# Patient Record
Sex: Female | Born: 1970
Health system: Southern US, Community
[De-identification: ages and names within clinical notes are randomized; demographics above are authoritative.]

## PROBLEM LIST (undated history)

## (undated) DIAGNOSIS — I10 Essential (primary) hypertension: Secondary | ICD-10-CM

## (undated) DIAGNOSIS — K432 Incisional hernia without obstruction or gangrene: Secondary | ICD-10-CM

## (undated) DIAGNOSIS — O039 Complete or unspecified spontaneous abortion without complication: Secondary | ICD-10-CM

## (undated) DIAGNOSIS — K8020 Calculus of gallbladder without cholecystitis without obstruction: Secondary | ICD-10-CM

## (undated) DIAGNOSIS — Z8632 Personal history of gestational diabetes: Secondary | ICD-10-CM

## (undated) DIAGNOSIS — K219 Gastro-esophageal reflux disease without esophagitis: Secondary | ICD-10-CM

## (undated) HISTORY — DX: Complete or unspecified spontaneous abortion without complication: O03.9

---
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
## 1988-11-10 HISTORY — PX: WISDOM TOOTH EXTRACTION: SHX21

## 1999-12-17 ENCOUNTER — Other Ambulatory Visit: Admission: RE | Admit: 1999-12-17 | Discharge: 1999-12-17 | Payer: Self-pay | Admitting: Obstetrics and Gynecology

## 2000-01-09 DIAGNOSIS — O039 Complete or unspecified spontaneous abortion without complication: Secondary | ICD-10-CM

## 2000-01-09 HISTORY — DX: Complete or unspecified spontaneous abortion without complication: O03.9

## 2000-02-01 ENCOUNTER — Inpatient Hospital Stay (HOSPITAL_COMMUNITY): Admission: AD | Admit: 2000-02-01 | Discharge: 2000-02-01 | Payer: Self-pay | Admitting: Obstetrics & Gynecology

## 2000-02-01 ENCOUNTER — Encounter: Payer: Self-pay | Admitting: Obstetrics & Gynecology

## 2000-02-07 ENCOUNTER — Observation Stay (HOSPITAL_COMMUNITY): Admission: AD | Admit: 2000-02-07 | Discharge: 2000-02-07 | Payer: Self-pay | Admitting: Obstetrics & Gynecology

## 2000-02-07 ENCOUNTER — Encounter: Payer: Self-pay | Admitting: Obstetrics & Gynecology

## 2000-08-13 ENCOUNTER — Encounter: Payer: Self-pay | Admitting: Obstetrics and Gynecology

## 2000-08-13 ENCOUNTER — Ambulatory Visit (HOSPITAL_COMMUNITY): Admission: RE | Admit: 2000-08-13 | Discharge: 2000-08-13 | Payer: Self-pay | Admitting: Obstetrics and Gynecology

## 2000-09-15 ENCOUNTER — Other Ambulatory Visit: Admission: RE | Admit: 2000-09-15 | Discharge: 2000-09-15 | Payer: Self-pay | Admitting: Obstetrics and Gynecology

## 2000-11-10 DIAGNOSIS — Z8632 Personal history of gestational diabetes: Secondary | ICD-10-CM

## 2000-11-10 HISTORY — DX: Personal history of gestational diabetes: Z86.32

## 2001-01-29 ENCOUNTER — Encounter: Admission: RE | Admit: 2001-01-29 | Discharge: 2001-01-29 | Payer: Self-pay | Admitting: Obstetrics and Gynecology

## 2001-03-09 ENCOUNTER — Encounter (HOSPITAL_COMMUNITY): Admission: RE | Admit: 2001-03-09 | Discharge: 2001-03-23 | Payer: Self-pay | Admitting: Obstetrics and Gynecology

## 2001-03-22 ENCOUNTER — Inpatient Hospital Stay (HOSPITAL_COMMUNITY): Admission: AD | Admit: 2001-03-22 | Discharge: 2001-03-25 | Payer: Self-pay | Admitting: Obstetrics and Gynecology

## 2001-04-20 ENCOUNTER — Other Ambulatory Visit: Admission: RE | Admit: 2001-04-20 | Discharge: 2001-04-20 | Payer: Self-pay | Admitting: Obstetrics and Gynecology

## 2002-06-01 ENCOUNTER — Other Ambulatory Visit: Admission: RE | Admit: 2002-06-01 | Discharge: 2002-06-01 | Payer: Self-pay | Admitting: Obstetrics and Gynecology

## 2003-06-06 ENCOUNTER — Other Ambulatory Visit: Admission: RE | Admit: 2003-06-06 | Discharge: 2003-06-06 | Payer: Self-pay | Admitting: Obstetrics and Gynecology

## 2010-12-10 ENCOUNTER — Other Ambulatory Visit: Payer: Self-pay | Admitting: Obstetrics and Gynecology

## 2010-12-10 DIAGNOSIS — Z1231 Encounter for screening mammogram for malignant neoplasm of breast: Secondary | ICD-10-CM

## 2010-12-10 DIAGNOSIS — Z1239 Encounter for other screening for malignant neoplasm of breast: Secondary | ICD-10-CM

## 2010-12-18 ENCOUNTER — Ambulatory Visit
Admission: RE | Admit: 2010-12-18 | Discharge: 2010-12-18 | Disposition: A | Discharge status: Home / Self Care | Payer: BC Managed Care – PPO | Source: Ambulatory Visit | Attending: Obstetrics and Gynecology | Admitting: Obstetrics and Gynecology

## 2010-12-18 DIAGNOSIS — Z1231 Encounter for screening mammogram for malignant neoplasm of breast: Secondary | ICD-10-CM

## 2012-01-06 ENCOUNTER — Other Ambulatory Visit: Payer: Self-pay | Admitting: Obstetrics and Gynecology

## 2012-01-06 DIAGNOSIS — Z1231 Encounter for screening mammogram for malignant neoplasm of breast: Secondary | ICD-10-CM

## 2012-01-16 ENCOUNTER — Ambulatory Visit: Payer: BC Managed Care – PPO

## 2012-01-20 ENCOUNTER — Ambulatory Visit
Admission: RE | Admit: 2012-01-20 | Discharge: 2012-01-20 | Disposition: A | Discharge status: Home / Self Care | Payer: BC Managed Care – PPO | Source: Ambulatory Visit | Attending: Obstetrics and Gynecology | Admitting: Obstetrics and Gynecology

## 2012-01-20 DIAGNOSIS — Z1231 Encounter for screening mammogram for malignant neoplasm of breast: Secondary | ICD-10-CM

## 2012-07-11 HISTORY — PX: TYMPANOSTOMY TUBE PLACEMENT: SHX32

## 2013-02-05 DIAGNOSIS — R1110 Vomiting, unspecified: Secondary | ICD-10-CM | POA: Insufficient documentation

## 2013-02-05 DIAGNOSIS — R1013 Epigastric pain: Secondary | ICD-10-CM | POA: Insufficient documentation

## 2013-02-05 LAB — URINALYSIS, ROUTINE W REFLEX MICROSCOPIC
Bilirubin Urine: NEGATIVE
Glucose, UA: NEGATIVE mg/dL
Ketones, ur: 15 mg/dL — AB
Leukocytes, UA: NEGATIVE
Nitrite: NEGATIVE
Protein, ur: NEGATIVE mg/dL
Specific Gravity, Urine: 1.029 (ref 1.005–1.030)
Urobilinogen, UA: 0.2 mg/dL (ref 0.0–1.0)
pH: 5 (ref 5.0–8.0)

## 2013-02-05 LAB — COMPREHENSIVE METABOLIC PANEL
ALT: 16 U/L (ref 0–35)
AST: 15 U/L (ref 0–37)
Albumin: 3.9 g/dL (ref 3.5–5.2)
Alkaline Phosphatase: 56 U/L (ref 39–117)
BUN: 15 mg/dL (ref 6–23)
CO2: 27 mEq/L (ref 19–32)
Calcium: 9.8 mg/dL (ref 8.4–10.5)
Chloride: 102 mEq/L (ref 96–112)
Creatinine, Ser: 0.76 mg/dL (ref 0.50–1.10)
GFR calc Af Amer: >90 mL/min (ref >90)
GFR calc non Af Amer: >90 mL/min (ref >90)
Glucose, Bld: 120 mg/dL — ABNORMAL HIGH (ref 70–99)
Potassium: 3.8 mEq/L (ref 3.5–5.1)
Sodium: 138 mEq/L (ref 135–145)
Total Bilirubin: 0.3 mg/dL (ref 0.3–1.2)
Total Protein: 7.1 g/dL (ref 6.0–8.3)

## 2013-02-05 LAB — CBC WITH DIFFERENTIAL/PLATELET
Basophils Absolute: 0 10*3/uL (ref 0.0–0.1)
Basophils Relative: 0 % (ref 0–1)
Eosinophils Absolute: 0.1 10*3/uL (ref 0.0–0.7)
Eosinophils Relative: 1 % (ref 0–5)
HCT: 38.2 % (ref 36.0–46.0)
Hemoglobin: 13.4 g/dL (ref 12.0–15.0)
Lymphocytes Relative: 25 % (ref 12–46)
Lymphs Abs: 2 10*3/uL (ref 0.7–4.0)
MCH: 29.8 pg (ref 26.0–34.0)
MCHC: 35.1 g/dL (ref 30.0–36.0)
MCV: 84.9 fL (ref 78.0–100.0)
Monocytes Absolute: 0.4 10*3/uL (ref 0.1–1.0)
Monocytes Relative: 5 % (ref 3–12)
Neutro Abs: 5.5 10*3/uL (ref 1.7–7.7)
Neutrophils Relative %: 68 % (ref 43–77)
Platelets: 231 10*3/uL (ref 150–400)
RBC: 4.5 MIL/uL (ref 3.87–5.11)
RDW: 13.2 % (ref 11.5–15.5)
WBC: 8.1 10*3/uL (ref 4.0–10.5)

## 2013-02-05 LAB — LIPASE, BLOOD: Lipase: 42 U/L (ref 11–59)

## 2013-02-05 LAB — AMYLASE: Amylase: 51 U/L (ref 0–105)

## 2013-02-05 LAB — URINE MICROSCOPIC-ADD ON

## 2013-02-05 LAB — POCT PREGNANCY, URINE: Preg Test, Ur: NEGATIVE

## 2013-02-05 NOTE — ED Notes (Signed)
Pt c/o Abdominal pain, back pain, and HA. Nausea and vomiting. Vomiting x 10. LMP now. Self administered tums x 2, and ibuprofen x 2 with no relief

## 2013-02-06 ENCOUNTER — Encounter (HOSPITAL_COMMUNITY): Payer: Self-pay | Admitting: Emergency Medicine

## 2013-02-06 ENCOUNTER — Emergency Department (HOSPITAL_COMMUNITY)
Admission: EM | Admit: 2013-02-06 | Discharge: 2013-02-06 | Disposition: A | Discharge status: Home / Self Care | Payer: BC Managed Care – PPO | Attending: Emergency Medicine | Admitting: Emergency Medicine

## 2013-02-06 DIAGNOSIS — R109 Unspecified abdominal pain: Secondary | ICD-10-CM

## 2013-02-06 DIAGNOSIS — R1110 Vomiting, unspecified: Secondary | ICD-10-CM

## 2013-02-06 MED ORDER — ONDANSETRON 8 MG PO TBDP: 8.0000 mg | ORAL_TABLET | Freq: Three times a day (TID) | ORAL | Status: DC | PRN

## 2013-02-06 MED ORDER — PANTOPRAZOLE SODIUM 40 MG PO TBEC
40.0000 mg | DELAYED_RELEASE_TABLET | Freq: Every day | ORAL | Status: DC
  Filled 2013-02-06: qty 1

## 2013-02-06 MED ORDER — SUCRALFATE 1 G PO TABS
1.0000 g | ORAL_TABLET | Freq: Once | ORAL | Status: AC
  Administered 2013-02-06: 1 g via ORAL
  Filled 2013-02-06: qty 1

## 2013-02-06 MED ORDER — PANTOPRAZOLE SODIUM 40 MG PO TBEC: 40.0000 mg | DELAYED_RELEASE_TABLET | Freq: Every day | ORAL | Status: AC

## 2013-02-06 MED ORDER — ONDANSETRON 4 MG PO TBDP
8.0000 mg | ORAL_TABLET | Freq: Once | ORAL | Status: AC
  Administered 2013-02-06: 8 mg via ORAL
  Filled 2013-02-06: qty 2

## 2013-02-06 MED ORDER — SUCRALFATE 1 G PO TABS: 1.0000 g | ORAL_TABLET | Freq: Four times a day (QID) | ORAL | Status: DC

## 2013-02-06 NOTE — ED Notes (Signed)
Pt given sprite and graham crackers to do PO challenge. Told pt to notify if she feels sick or gets sick.

## 2013-02-06 NOTE — ED Provider Notes (Signed)
History     CSN: [id number]  Arrival date & time 02/05/13  2252   First MD Initiated Contact with Patient 02/06/13 [phone]      Chief Complaint  Patient presents with  . Abdominal Pain    (Consider location/radiation/quality/duration/timing/severity/associated sxs/prior treatment) HPI 42 year old female presents to emergency department with complaint of nausea and vomiting and upper abdominal pain.  She reports over last 2 weeks she's had some intermittent episodes of what she thought was reflux with discomfort at night, which improved with tums and sitting upright.  Tonight, however, soon after eating she began to have upper abdominal discomfort followed by several episodes of vomiting.  She then had pains radiating across her back.  Patient estimates she vomited about 10 times.  No prior history of similar symptoms.  Patient reports since waiting in the lobby, she is feeling somewhat better.  She has not had any further vomiting.  She describes the pain as a deep boring pain, just above her bellybutton, radiating into her epigastric region.  There is bilateral back pain, slightly worse on the left.  No diarrhea, no fever, no sick contacts, no unusual foods.  No prior abdominal surgeries aside from C-section. No past medical history on file.  No past surgical history on file.  No family history on file.  History  Substance Use Topics  . Smoking status: Not on file  . Smokeless tobacco: Not on file  . Alcohol Use: Not on file    OB History   No data available      Review of Systems  All other systems reviewed and are negative.    Allergies  Cephalosporins  Home Medications   Current Outpatient Rx  Name  Route  Sig  Dispense  Refill  . acetaminophen (TYLENOL) 500 MG tablet   Oral   Take 500 mg by mouth every 6 (six) hours as needed for pain.         . calcium carbonate (TUMS - DOSED IN MG ELEMENTAL CALCIUM) 500 MG chewable tablet   Oral   Chew 2 tablets by mouth  daily as needed for heartburn.         Marland Kitchen ibuprofen (ADVIL,MOTRIN) 200 MG tablet   Oral   Take 400 mg by mouth every 6 (six) hours as needed for pain.           BP 157/98  Pulse 68  Temp(Src) 97.7 F (36.5 C) (Oral)  Resp 20  SpO2 100%  Physical Exam  Nursing note and vitals reviewed. Constitutional: She is oriented to person, place, and time. She appears well-developed and well-nourished.  HENT:  Head: Normocephalic and atraumatic.  Nose: Nose normal.  Mouth/Throat: Oropharynx is clear and moist.  Eyes: Conjunctivae and EOM are normal. Pupils are equal, round, and reactive to light.  Neck: Normal range of motion. Neck supple. No JVD present. No tracheal deviation present. No thyromegaly present.  Cardiovascular: Normal rate, regular rhythm, normal heart sounds and intact distal pulses.  Exam reveals no gallop and no friction rub.   No murmur heard. Pulmonary/Chest: Effort normal and breath sounds normal. No stridor. No respiratory distress. She has no wheezes. She has no rales. She exhibits no tenderness.  Abdominal: Soft. Bowel sounds are normal. She exhibits no distension and no mass. There is tenderness (mild abdominal tenderness in epigastrium, and just above the umbilicus). There is no rebound and no guarding.  No Murphy sign, no rebound, no guarding  Musculoskeletal: Normal range of motion. She exhibits  no edema and no tenderness.  Lymphadenopathy:    She has no cervical adenopathy.  Neurological: She is alert and oriented to person, place, and time. She exhibits normal muscle tone. Coordination normal.  Skin: Skin is warm and dry. No rash noted. No erythema. No pallor.  Psychiatric: She has a normal mood and affect. Her behavior is normal. Judgment and thought content normal.    ED Course  Procedures (including critical care time)  Labs Reviewed  COMPREHENSIVE METABOLIC PANEL - Abnormal; Notable for the following:    Glucose, Bld 120 (*)    All other components  within normal limits  URINALYSIS, ROUTINE W REFLEX MICROSCOPIC - Abnormal; Notable for the following:    Hgb urine dipstick MODERATE (*)    Ketones, ur 15 (*)    All other components within normal limits  URINE MICROSCOPIC-ADD ON - Abnormal; Notable for the following:    Squamous Epithelial / LPF FEW (*)    Bacteria, UA FEW (*)    Casts HYALINE CASTS (*)    All other components within normal limits  CBC WITH DIFFERENTIAL  AMYLASE  LIPASE, BLOOD  POCT PREGNANCY, URINE   No results found.   1. Abdominal pain, acute   2. Vomiting       MDM  42 year old female with acute abdominal pain with vomiting.  Labs are reassuring.  Exam is unremarkable.  Patient may have gastritis or and/or early ulcer.  Will start on Carafate and Protonix.  We'll refer her to gastroenterology.         Olivia Mackie, MD 02/06/13 [phone]

## 2013-02-10 ENCOUNTER — Other Ambulatory Visit: Payer: Self-pay | Admitting: Internal Medicine

## 2013-02-10 DIAGNOSIS — R109 Unspecified abdominal pain: Secondary | ICD-10-CM

## 2013-02-15 ENCOUNTER — Ambulatory Visit
Admission: RE | Admit: 2013-02-15 | Discharge: 2013-02-15 | Disposition: A | Discharge status: Home / Self Care | Payer: BC Managed Care – PPO | Source: Ambulatory Visit | Attending: Internal Medicine | Admitting: Internal Medicine

## 2013-02-15 ENCOUNTER — Other Ambulatory Visit: Payer: Self-pay | Admitting: Internal Medicine

## 2013-02-15 DIAGNOSIS — R1011 Right upper quadrant pain: Secondary | ICD-10-CM

## 2013-02-15 DIAGNOSIS — R110 Nausea: Secondary | ICD-10-CM

## 2013-02-15 DIAGNOSIS — R109 Unspecified abdominal pain: Secondary | ICD-10-CM

## 2013-02-16 ENCOUNTER — Ambulatory Visit
Admission: RE | Admit: 2013-02-16 | Discharge: 2013-02-16 | Disposition: A | Discharge status: Home / Self Care | Payer: BC Managed Care – PPO | Source: Ambulatory Visit | Attending: Internal Medicine | Admitting: Internal Medicine

## 2013-02-16 DIAGNOSIS — R1011 Right upper quadrant pain: Secondary | ICD-10-CM

## 2013-02-16 DIAGNOSIS — R110 Nausea: Secondary | ICD-10-CM

## 2013-03-01 ENCOUNTER — Ambulatory Visit (INDEPENDENT_AMBULATORY_CARE_PROVIDER_SITE_OTHER): Payer: BC Managed Care – PPO | Admitting: General Surgery

## 2013-03-01 ENCOUNTER — Encounter (INDEPENDENT_AMBULATORY_CARE_PROVIDER_SITE_OTHER): Payer: Self-pay | Admitting: General Surgery

## 2013-03-01 VITALS — BP 138/72 | HR 69 | Temp 97.4°F | Resp 18 | Ht 64.0 in | Wt 174.2 lb

## 2013-03-01 DIAGNOSIS — K8020 Calculus of gallbladder without cholecystitis without obstruction: Secondary | ICD-10-CM | POA: Insufficient documentation

## 2013-03-01 NOTE — Progress Notes (Signed)
Patient ID: Julia Miranda, female   DOB: 06-22-1971, 42 y.o.   MRN: [id number]  Chief Complaint  Patient presents with  . New Evaluation    eval GB    HPI Julia Miranda is a 42 y.o. female.  She is referred by Dr. Clinton Sawyer for evaluation and management of symptomatic gallstones. Dr. Theressa Millard is her primary care physician.  The patient has been having some mild episodes of epigastric discomfort for about 2 months. On March 29 she had a severe episode of epigastric pain that radiated to her back associated with nausea and vomiting. She took tums, but that did not help. After about 5 hours she went to the emergency room. Lab work was done which was normal. She was discharged home and told she might have an ulcer. The pain resolved after a total of about 8 hours. She another episode about 10 days later which was also self-limited. These attacks are not necessarily related to meals. An upper GI was performed which shows moderate reflux but no ulcer. An ultrasound shows a 2.6 cm mobile gallstone, no inflammation, no ductal dilatation, small right renal cyst. She has been watching what he she eats. She is on a proton pump inhibitor.Hasn't had any discomfort for about 4 or 5 days.  Comorbidities are minimal, and include the reflux it was in the upper GI, borderline obesity and otherwise she is healthy. She is married with 3 children  HPI  Past Medical History  Diagnosis Date  . Miscarriage     06/1999  . Miscarriage 01/2000    Past Surgical History  Procedure Laterality Date  . Cesarean section  09/18/1995  . Cesarean section  03/22/2001  . Tympanostomy tube placement  07/2012    Left    Family History  Problem Relation Age of Onset  . Hypertension Mother   . Hyperlipidemia Mother   . Diabetes Father   . Cancer Father     Prostate,Melanoma.    Social History History  Substance Use Topics  . Smoking status: Never Smoker   . Smokeless tobacco: Never Used  . Alcohol  Use: No    Allergies  Allergen Reactions  . Cephalosporins Hives    Current Outpatient Prescriptions  Medication Sig Dispense Refill  . acetaminophen (TYLENOL) 500 MG tablet Take 500 mg by mouth every 6 (six) hours as needed for pain.      . chlorpheniramine-HYDROcodone (TUSSIONEX) 10-8 MG/5ML LQCR       . doxycycline (VIBRA-TABS) 100 MG tablet       . ondansetron (ZOFRAN-ODT) 8 MG disintegrating tablet Take 1 tablet (8 mg total) by mouth every 8 (eight) hours as needed for nausea.  20 tablet  0  . pantoprazole (PROTONIX) 40 MG tablet Take 1 tablet (40 mg total) by mouth daily.  30 tablet  0   No current facility-administered medications for this visit.    Review of Systems Review of Systems  Constitutional: Negative for fever, chills and unexpected weight change.  HENT: Negative for hearing loss, congestion, sore throat, trouble swallowing and voice change.   Eyes: Negative for visual disturbance.  Respiratory: Negative for cough and wheezing.   Cardiovascular: Negative for chest pain, palpitations and leg swelling.  Gastrointestinal: Positive for nausea, vomiting and abdominal pain. Negative for diarrhea, constipation, blood in stool, abdominal distention and anal bleeding.  Genitourinary: Negative for hematuria, vaginal bleeding and difficulty urinating.  Musculoskeletal: Negative for arthralgias.  Skin: Negative for rash and wound.  Neurological: Negative  for seizures, syncope and headaches.  Hematological: Negative for adenopathy. Does not bruise/bleed easily.  Psychiatric/Behavioral: Negative for confusion.    Blood pressure 138/72, pulse 69, temperature 97.4 F (36.3 C), temperature source Temporal, resp. rate 18, height 5\' 4"  (1.626 m), weight 174 lb 3.2 oz (79.017 kg), last menstrual period 02/05/2013.  Physical Exam Physical Exam  Constitutional: She is oriented to person, place, and time. She appears well-developed and well-nourished. No distress.  HENT:  Head:  Normocephalic and atraumatic.  Nose: Nose normal.  Mouth/Throat: No oropharyngeal exudate.  Eyes: Conjunctivae and EOM are normal. Pupils are equal, round, and reactive to light. Left eye exhibits no discharge. No scleral icterus.  Neck: Neck supple. No JVD present. No tracheal deviation present. No thyromegaly present.  Cardiovascular: Normal rate, regular rhythm, normal heart sounds and intact distal pulses.   No murmur heard. Pulmonary/Chest: Effort normal and breath sounds normal. No respiratory distress. She has no wheezes. She has no rales. She exhibits no tenderness.  Abdominal: Soft. Bowel sounds are normal. She exhibits no distension and no mass. There is no tenderness. There is no rebound and no guarding.  Well healed Pfannenstiel incision.  Musculoskeletal: She exhibits no edema and no tenderness.  Lymphadenopathy:    She has no cervical adenopathy.  Neurological: She is alert and oriented to person, place, and time. She exhibits normal muscle tone. Coordination normal.  Skin: Skin is warm. No rash noted. She is not diaphoretic. No erythema. No pallor.  Psychiatric: She has a normal mood and affect. Her behavior is normal. Judgment and thought content normal.    Data Reviewed Notes from [facility]. Meds from emergency department. Lab work. Ultrasound. Upper GI.  Assessment    Chronic cholecystitis with cholelithiasis. Recurrent  frequent, recent episodes of biliary colic.  Gastroesophageal reflux demonstrated on upper GI     Plan    The patient will be scheduled for laparoscopic cholecystectomy with cholangiogram in the near future.  I discussed the indications, details, techniques, and numerous risk of the surgery with her. She understands all these issues and all of her questions are answered. She agrees with this plan.        Angelia Mould. Derrell Lolling, M.D., [facility] General and Minimally invasive Surgery Breast and Colorectal  Surgery Office:   [phone] Pager:   [phone]  03/01/2013, 3:30 PM

## 2013-03-01 NOTE — Patient Instructions (Signed)
You have gallstones documented on your ultrasound, and the attacks of pain that you have been having are very typical for gallbladder attacks. This will continue until something is done, and the only good option is a gallbladder operation.  You will be scheduled for a laparoscopic cholecystectomy with cholangiogram, possible open in the near future.        Laparoscopic Cholecystectomy Laparoscopic cholecystectomy is surgery to remove the gallbladder. The gallbladder is located slightly to the right of center in the abdomen, behind the liver. It is a concentrating and storage sac for the bile produced in the liver. Bile aids in the digestion and absorption of fats. Gallbladder disease (cholecystitis) is an inflammation of your gallbladder. This condition is usually caused by a buildup of gallstones (cholelithiasis) in your gallbladder. Gallstones can block the flow of bile, resulting in inflammation and pain. In severe cases, emergency surgery may be required. When emergency surgery is not required, you will have time to prepare for the procedure. Laparoscopic surgery is an alternative to open surgery. Laparoscopic surgery usually has a shorter recovery time. Your common bile duct may also need to be examined and explored. Your caregiver will discuss this with you if he or she feels this should be done. If stones are found in the common bile duct, they may be removed. LET YOUR CAREGIVER KNOW ABOUT:  Allergies to food or medicine.  Medicines taken, including vitamins, herbs, eyedrops, over-the-counter medicines, and creams.  Use of steroids (by mouth or creams).  Previous problems with anesthetics or numbing medicines.  History of bleeding problems or blood clots.  Previous surgery.  Other health problems, including diabetes and kidney problems.  Possibility of pregnancy, if this applies. RISKS AND COMPLICATIONS All surgery is associated with risks. Some problems that may occur following  this procedure include:  Infection.  Damage to the common bile duct, nerves, arteries, veins, or other internal organs such as the stomach or intestines.  Bleeding.  A stone may remain in the common bile duct. BEFORE THE PROCEDURE  Do not take aspirin for 3 days prior to surgery or blood thinners for 1 week prior to surgery.  Do not eat or drink anything after midnight the night before surgery.  Let your caregiver know if you develop a cold or other infectious problem prior to surgery.  You should be present 60 minutes before the procedure or as directed. PROCEDURE  You will be given medicine that makes you sleep (general anesthetic). When you are asleep, your surgeon will make several small cuts (incisions) in your abdomen. One of these incisions is used to insert a small, lighted scope (laparoscope) into the abdomen. The laparoscope helps the surgeon see into your abdomen. Carbon dioxide gas will be pumped into your abdomen. The gas allows more room for the surgeon to perform your surgery. Other operating instruments are inserted through the other incisions. Laparoscopic procedures may not be appropriate when:  There is major scarring from previous surgery.  The gallbladder is extremely inflamed.  There are bleeding disorders or unexpected cirrhosis of the liver.  A pregnancy is near term.  Other conditions make the laparoscopic procedure impossible. If your surgeon feels it is not safe to continue with a laparoscopic procedure, he or she will perform an open abdominal procedure. In this case, the surgeon will make an incision to open the abdomen. This gives the surgeon a larger view and field to work within. This may allow the surgeon to perform procedures that sometimes cannot be  performed with a laparoscope alone. Open surgery has a longer recovery time. AFTER THE PROCEDURE  You will be taken to the recovery area where a nurse will watch and check your progress.  You may be  allowed to go home the same day.  Do not resume physical activities until directed by your caregiver.  You may resume a normal diet and activities as directed. Document Released: 10/27/2005 Document Revised: 01/19/2012 Document Reviewed: 04/11/2011 [facility] Patient Information 2013 [place], [place].

## 2013-03-04 ENCOUNTER — Encounter (HOSPITAL_COMMUNITY): Payer: Self-pay | Admitting: Pharmacy Technician

## 2013-03-08 ENCOUNTER — Other Ambulatory Visit (HOSPITAL_COMMUNITY): Payer: Self-pay | Admitting: *Deleted

## 2013-03-09 ENCOUNTER — Encounter (HOSPITAL_COMMUNITY): Payer: Self-pay

## 2013-03-09 ENCOUNTER — Encounter (HOSPITAL_COMMUNITY)
Admission: RE | Admit: 2013-03-09 | Discharge: 2013-03-09 | Disposition: A | Discharge status: Home / Self Care | Payer: BC Managed Care – PPO | Source: Ambulatory Visit | Attending: General Surgery | Admitting: General Surgery

## 2013-03-09 DIAGNOSIS — K8010 Calculus of gallbladder with chronic cholecystitis without obstruction: Secondary | ICD-10-CM | POA: Insufficient documentation

## 2013-03-09 DIAGNOSIS — Z01812 Encounter for preprocedural laboratory examination: Secondary | ICD-10-CM | POA: Insufficient documentation

## 2013-03-09 HISTORY — DX: Gastro-esophageal reflux disease without esophagitis: K21.9

## 2013-03-09 HISTORY — DX: Personal history of gestational diabetes: Z86.32

## 2013-03-09 HISTORY — DX: Calculus of gallbladder without cholecystitis without obstruction: K80.20

## 2013-03-09 LAB — CBC
HCT: 36.9 % (ref 36.0–46.0)
Hemoglobin: 12.9 g/dL (ref 12.0–15.0)
MCH: 29.3 pg (ref 26.0–34.0)
MCHC: 35 g/dL (ref 30.0–36.0)
MCV: 83.7 fL (ref 78.0–100.0)
Platelets: 255 10*3/uL (ref 150–400)
RBC: 4.41 MIL/uL (ref 3.87–5.11)
RDW: 13 % (ref 11.5–15.5)
WBC: 5.8 10*3/uL (ref 4.0–10.5)

## 2013-03-09 LAB — SURGICAL PCR SCREEN
MRSA, PCR: NEGATIVE
Staphylococcus aureus: NEGATIVE

## 2013-03-09 LAB — HCG, SERUM, QUALITATIVE: Preg, Serum: NEGATIVE

## 2013-03-09 NOTE — Patient Instructions (Signed)
Julia Miranda  03/09/2013                           YOUR PROCEDURE IS SCHEDULED ON: 03/17/13               PLEASE REPORT TO SHORT STAY CENTER AT : 6:30 AM               CALL THIS NUMBER IF ANY PROBLEMS THE DAY OF SURGERY :               [phone]                      REMEMBER:   Do not eat food or drink liquids AFTER MIDNIGHT   Take these medicines the morning of surgery with A SIP OF WATER:  NONE   Do not wear jewelry, make-up   Do not wear lotions, powders, or perfumes.   Do not shave legs or underarms 12 hrs. before surgery (men may shave face)  Do not bring valuables to the hospital.  Contacts, dentures or bridgework may not be worn into surgery.  Leave suitcase in the car. After surgery it may be brought to your room.  For patients admitted to the hospital more than one night, checkout time is 11:00                          The day of discharge.   Patients discharged the day of surgery will not be allowed to drive home                             If going home same day of surgery, must have someone stay with you first                           24 hrs at home and arrange for some one to drive you home from hospital.    Special Instructions:   Please read over the following fact sheets that you were given:               1. MRSA  INFORMATION                      2. [place] PREPARING FOR SURGERY SHEET                                                X_____________________________________________________________________        Failure to follow these instructions may result in cancellation of your surgery

## 2013-03-16 NOTE — H&P (Signed)
Julia Miranda   MRN:  [id number]   Description: 42 year old female  Provider: [name], MD  Department: [facility]       Diagnoses    Gallstones    -  Primary    574.20       Current Vitals -    BP Pulse Temp(Src) Resp Ht Wt    138/72 69 97.4 F (36.3 C) (Temporal) 18 5\' 4"  (1.626 m) 174 lb 3.2 oz (79.017 kg)     BMI - 29.89 kg/m2 02/05/2013              History and Physical    [name], MD   Status: Signed                          HPI Julia Miranda is a 42 y.o. female.  She is referred by Dr. Clinton Sawyer for evaluation and management of symptomatic gallstones. Dr. Theressa Millard is her primary care physician.   The patient has been having some mild episodes of epigastric discomfort for about 2 months. On March 29 she had a severe episode of epigastric pain that radiated to her back associated with nausea and vomiting. She took tums, but that did not help. After about 5 hours she went to the emergency room. Lab work was done which was normal. She was discharged home and told she might have an ulcer. The pain resolved after a total of about 8 hours. She another episode about 10 days later which was also self-limited. These attacks are not necessarily related to meals. An upper GI was performed which shows moderate reflux but no ulcer. An ultrasound shows a 2.6 cm mobile gallstone, no inflammation, no ductal dilatation, small right renal cyst. She has been watching what he she eats. She is on a proton pump inhibitor.Hasn't had any discomfort for about 4 or 5 days.   Comorbidities are minimal, and include the reflux it was in the upper GI, borderline obesity and otherwise she is healthy. She is married with 3 children        Past Medical History   Diagnosis  Date   .  Miscarriage         06/1999   .  Miscarriage  01/2000         Past Surgical History   Procedure  Laterality  Date   .  Cesarean section    09/18/1995   .  Cesarean  section    03/22/2001   .  Tympanostomy tube placement    07/2012       Left         Family History   Problem  Relation  Age of Onset   .  Hypertension  Mother     .  Hyperlipidemia  Mother     .  Diabetes  Father     .  Cancer  Father         Prostate,Melanoma.        Social History History   Substance Use Topics   .  Smoking status:  Never Smoker    .  Smokeless tobacco:  Never Used   .  Alcohol Use:  No         Allergies   Allergen  Reactions   .  Cephalosporins  Hives         Current Outpatient Prescriptions   Medication  Sig  Dispense  Refill   .  acetaminophen (TYLENOL) 500 MG tablet  Take 500 mg by mouth every 6 (six) hours as needed for pain.         .  chlorpheniramine-HYDROcodone (TUSSIONEX) 10-8 MG/5ML LQCR           .  doxycycline (VIBRA-TABS) 100 MG tablet           .  ondansetron (ZOFRAN-ODT) 8 MG disintegrating tablet  Take 1 tablet (8 mg total) by mouth every 8 (eight) hours as needed for nausea.   20 tablet   0   .  pantoprazole (PROTONIX) 40 MG tablet  Take 1 tablet (40 mg total) by mouth daily.   30 tablet   0       No current facility-administered medications for this visit.        Review of Systems   Constitutional: Negative for fever, chills and unexpected weight change.  HENT: Negative for hearing loss, congestion, sore throat, trouble swallowing and voice change.   Eyes: Negative for visual disturbance.  Respiratory: Negative for cough and wheezing.   Cardiovascular: Negative for chest pain, palpitations and leg swelling.  Gastrointestinal: Positive for nausea, vomiting and abdominal pain. Negative for diarrhea, constipation, blood in stool, abdominal distention and anal bleeding.  Genitourinary: Negative for hematuria, vaginal bleeding and difficulty urinating.  Musculoskeletal: Negative for arthralgias.  Skin: Negative for rash and wound.  Neurological: Negative for seizures, syncope and headaches.  Hematological: Negative for  adenopathy. Does not bruise/bleed easily.  Psychiatric/Behavioral: Negative for confusion.      Blood pressure 138/72, pulse 69, temperature 97.4 F (36.3 C), temperature source Temporal, resp. rate 18, height 5\' 4"  (1.626 m), weight 174 lb 3.2 oz (79.017 kg), last menstrual period 02/05/2013.   Physical Exam  Constitutional: She is oriented to person, place, and time. She appears well-developed and well-nourished. No distress.  HENT:   Head: Normocephalic and atraumatic.   Nose: Nose normal.   Mouth/Throat: No oropharyngeal exudate.  Eyes: Conjunctivae and EOM are normal. Pupils are equal, round, and reactive to light. Left eye exhibits no discharge. No scleral icterus.  Neck: Neck supple. No JVD present. No tracheal deviation present. No thyromegaly present.  Cardiovascular: Normal rate, regular rhythm, normal heart sounds and intact distal pulses.    No murmur heard. Pulmonary/Chest: Effort normal and breath sounds normal. No respiratory distress. She has no wheezes. She has no rales. She exhibits no tenderness.  Abdominal: Soft. Bowel sounds are normal. She exhibits no distension and no mass. There is no tenderness. There is no rebound and no guarding.  Well healed Pfannenstiel incision.  Musculoskeletal: She exhibits no edema and no tenderness.  Lymphadenopathy:    She has no cervical adenopathy.  Neurological: She is alert and oriented to person, place, and time. She exhibits normal muscle tone. Coordination normal.  Skin: Skin is warm. No rash noted. She is not diaphoretic. No erythema. No pallor.  Psychiatric: She has a normal mood and affect. Her behavior is normal. Judgment and thought content normal.      Data Reviewed Notes from [facility] [place]. Meds from emergency department. Lab work. Ultrasound. Upper GI.   Assessment    Chronic cholecystitis with cholelithiasis. Recurrent , frequent, recent episodes of biliary colic.   Gastroesophageal reflux  demonstrated on upper GI      Plan    The patient will be scheduled for laparoscopic cholecystectomy with cholangiogram in the near future.   I discussed the indications, details, techniques, and  numerous risk of the surgery with her. She understands all these issues and all of her questions are answered. She agrees with this plan.           Angelia Mould. Derrell Lolling, M.D., [facility] General and Minimally invasive Surgery Breast and Colorectal Surgery Office:   [phone] Pager:   [phone]

## 2013-03-17 ENCOUNTER — Encounter (HOSPITAL_COMMUNITY): Payer: Self-pay | Admitting: Anesthesiology

## 2013-03-17 ENCOUNTER — Ambulatory Visit (HOSPITAL_COMMUNITY)
Admission: RE | Admit: 2013-03-17 | Discharge: 2013-03-17 | Disposition: A | Discharge status: Home / Self Care | Payer: BC Managed Care – PPO | Source: Ambulatory Visit | Attending: General Surgery | Admitting: General Surgery

## 2013-03-17 ENCOUNTER — Encounter (HOSPITAL_COMMUNITY): Payer: Self-pay | Admitting: *Deleted

## 2013-03-17 ENCOUNTER — Ambulatory Visit (HOSPITAL_COMMUNITY): Payer: BC Managed Care – PPO

## 2013-03-17 ENCOUNTER — Encounter (HOSPITAL_COMMUNITY)
Admission: RE | Disposition: A | Discharge status: Home / Self Care | Payer: Self-pay | Source: Ambulatory Visit | Attending: General Surgery

## 2013-03-17 ENCOUNTER — Ambulatory Visit (HOSPITAL_COMMUNITY): Payer: BC Managed Care – PPO | Admitting: Anesthesiology

## 2013-03-17 DIAGNOSIS — K219 Gastro-esophageal reflux disease without esophagitis: Secondary | ICD-10-CM | POA: Insufficient documentation

## 2013-03-17 DIAGNOSIS — K8020 Calculus of gallbladder without cholecystitis without obstruction: Secondary | ICD-10-CM | POA: Diagnosis present

## 2013-03-17 DIAGNOSIS — K8010 Calculus of gallbladder with chronic cholecystitis without obstruction: Secondary | ICD-10-CM

## 2013-03-17 DIAGNOSIS — K824 Cholesterolosis of gallbladder: Secondary | ICD-10-CM

## 2013-03-17 HISTORY — PX: CHOLECYSTECTOMY: SHX55

## 2013-03-17 LAB — COMPREHENSIVE METABOLIC PANEL
ALT: 13 U/L (ref 0–35)
AST: 16 U/L (ref 0–37)
Albumin: 3.2 g/dL — ABNORMAL LOW (ref 3.5–5.2)
Alkaline Phosphatase: 53 U/L (ref 39–117)
BUN: 9 mg/dL (ref 6–23)
CO2: 27 mEq/L (ref 19–32)
Calcium: 8.8 mg/dL (ref 8.4–10.5)
Chloride: 101 mEq/L (ref 96–112)
Creatinine, Ser: 0.65 mg/dL (ref 0.50–1.10)
GFR calc Af Amer: >90 mL/min (ref >90)
GFR calc non Af Amer: >90 mL/min (ref >90)
Glucose, Bld: 138 mg/dL — ABNORMAL HIGH (ref 70–99)
Potassium: 4.1 mEq/L (ref 3.5–5.1)
Sodium: 136 mEq/L (ref 135–145)
Total Bilirubin: 0.3 mg/dL (ref 0.3–1.2)
Total Protein: 6 g/dL (ref 6.0–8.3)

## 2013-03-17 SURGERY — LAPAROSCOPIC CHOLECYSTECTOMY WITH INTRAOPERATIVE CHOLANGIOGRAM
Anesthesia: General | Site: Abdomen

## 2013-03-17 MED ORDER — SODIUM CHLORIDE 0.9 % IR SOLN
Status: DC | PRN
  Administered 2013-03-17: 6 mL

## 2013-03-17 MED ORDER — SUCCINYLCHOLINE CHLORIDE 20 MG/ML IJ SOLN
INTRAMUSCULAR | Status: DC | PRN
  Administered 2013-03-17: 100 mg via INTRAVENOUS

## 2013-03-17 MED ORDER — LIDOCAINE HCL (CARDIAC) 20 MG/ML IV SOLN
INTRAVENOUS | Status: DC | PRN
  Administered 2013-03-17: 100 mg via INTRAVENOUS

## 2013-03-17 MED ORDER — CIPROFLOXACIN IN D5W 400 MG/200ML IV SOLN
INTRAVENOUS | Status: AC
  Filled 2013-03-17: qty 200

## 2013-03-17 MED ORDER — MIDAZOLAM HCL 5 MG/5ML IJ SOLN
INTRAMUSCULAR | Status: DC | PRN
  Administered 2013-03-17: 2 mg via INTRAVENOUS

## 2013-03-17 MED ORDER — IOHEXOL 300 MG/ML  SOLN
INTRAMUSCULAR | Status: DC | PRN
  Administered 2013-03-17: 6 mL via INTRAVENOUS

## 2013-03-17 MED ORDER — IOHEXOL 300 MG/ML  SOLN
INTRAMUSCULAR | Status: AC
  Filled 2013-03-17: qty 1

## 2013-03-17 MED ORDER — FENTANYL CITRATE 0.05 MG/ML IJ SOLN
INTRAMUSCULAR | Status: AC
  Filled 2013-03-17: qty 2

## 2013-03-17 MED ORDER — CIPROFLOXACIN IN D5W 400 MG/200ML IV SOLN
400.0000 mg | INTRAVENOUS | Status: AC
  Administered 2013-03-17: 400 mg via INTRAVENOUS

## 2013-03-17 MED ORDER — PROPOFOL 10 MG/ML IV BOLUS
INTRAVENOUS | Status: DC | PRN
  Administered 2013-03-17: 100 mg via INTRAVENOUS

## 2013-03-17 MED ORDER — LACTATED RINGERS IV SOLN: INTRAVENOUS | Status: DC

## 2013-03-17 MED ORDER — PROMETHAZINE HCL 25 MG/ML IJ SOLN
INTRAMUSCULAR | Status: AC
  Filled 2013-03-17: qty 1

## 2013-03-17 MED ORDER — ACETAMINOPHEN 10 MG/ML IV SOLN
INTRAVENOUS | Status: DC | PRN
  Administered 2013-03-17: 1000 mg via INTRAVENOUS

## 2013-03-17 MED ORDER — ONDANSETRON HCL 4 MG/2ML IJ SOLN
INTRAMUSCULAR | Status: DC | PRN
  Administered 2013-03-17: 4 mg via INTRAVENOUS

## 2013-03-17 MED ORDER — KETOROLAC TROMETHAMINE 30 MG/ML IJ SOLN
INTRAMUSCULAR | Status: AC
  Filled 2013-03-17: qty 1

## 2013-03-17 MED ORDER — KETOROLAC TROMETHAMINE 30 MG/ML IJ SOLN
30.0000 mg | Freq: Once | INTRAMUSCULAR | Status: AC
  Administered 2013-03-17: 30 mg via INTRAVENOUS

## 2013-03-17 MED ORDER — CHLORHEXIDINE GLUCONATE 4 % EX LIQD
1.0000 "application " | Freq: Once | CUTANEOUS | Status: DC
  Filled 2013-03-17: qty 15

## 2013-03-17 MED ORDER — FENTANYL CITRATE 0.05 MG/ML IJ SOLN
25.0000 ug | INTRAMUSCULAR | Status: DC | PRN
  Administered 2013-03-17: 50 ug via INTRAVENOUS

## 2013-03-17 MED ORDER — LACTATED RINGERS IV SOLN
INTRAVENOUS | Status: DC
  Administered 2013-03-17 (×2): via INTRAVENOUS

## 2013-03-17 MED ORDER — MEPERIDINE HCL 50 MG/ML IJ SOLN: 6.2500 mg | INTRAMUSCULAR | Status: DC | PRN

## 2013-03-17 MED ORDER — ACETAMINOPHEN 10 MG/ML IV SOLN
INTRAVENOUS | Status: AC
  Filled 2013-03-17: qty 100

## 2013-03-17 MED ORDER — BUPIVACAINE-EPINEPHRINE 0.5% -1:200000 IJ SOLN
INTRAMUSCULAR | Status: DC | PRN
  Administered 2013-03-17: 22 mL

## 2013-03-17 MED ORDER — FENTANYL CITRATE 0.05 MG/ML IJ SOLN
INTRAMUSCULAR | Status: DC | PRN
  Administered 2013-03-17 (×3): 50 ug via INTRAVENOUS

## 2013-03-17 MED ORDER — ROCURONIUM BROMIDE 100 MG/10ML IV SOLN
INTRAVENOUS | Status: DC | PRN
  Administered 2013-03-17: 30 mg via INTRAVENOUS

## 2013-03-17 MED ORDER — BUPIVACAINE-EPINEPHRINE 0.5% -1:200000 IJ SOLN
INTRAMUSCULAR | Status: AC
  Filled 2013-03-17: qty 1

## 2013-03-17 MED ORDER — HYDROCODONE-ACETAMINOPHEN 5-325 MG PO TABS: 1.0000 | ORAL_TABLET | ORAL | Status: DC | PRN

## 2013-03-17 MED ORDER — DEXAMETHASONE SODIUM PHOSPHATE 10 MG/ML IJ SOLN
INTRAMUSCULAR | Status: DC | PRN
  Administered 2013-03-17: 10 mg via INTRAVENOUS

## 2013-03-17 MED ORDER — NEOSTIGMINE METHYLSULFATE 1 MG/ML IJ SOLN
INTRAMUSCULAR | Status: DC | PRN
  Administered 2013-03-17: 4 mg via INTRAVENOUS

## 2013-03-17 MED ORDER — PROMETHAZINE HCL 25 MG/ML IJ SOLN
6.2500 mg | INTRAMUSCULAR | Status: DC | PRN
  Administered 2013-03-17: 6.25 mg via INTRAVENOUS

## 2013-03-17 MED ORDER — GLYCOPYRROLATE 0.2 MG/ML IJ SOLN
INTRAMUSCULAR | Status: DC | PRN
  Administered 2013-03-17: 0.6 mg via INTRAVENOUS

## 2013-03-17 MED ORDER — OXYCODONE HCL 5 MG PO TABS
5.0000 mg | ORAL_TABLET | ORAL | Status: DC | PRN
  Administered 2013-03-17: 5 mg via ORAL
  Filled 2013-03-17: qty 1
  Filled 2013-03-17: qty 2

## 2013-03-17 SURGICAL SUPPLY — 40 items
ADH SKN CLS APL DERMABOND .7 (GAUZE/BANDAGES/DRESSINGS) ×1
APL SKNCLS STERI-STRIP NONHPOA (GAUZE/BANDAGES/DRESSINGS) ×1
APPLIER CLIP ROT 10 11.4 M/L (STAPLE) ×2
APR CLP MED LRG 11.4X10 (STAPLE) ×1
BAG SPEC RTRVL LRG 6X4 10 (ENDOMECHANICALS) ×1
BENZOIN TINCTURE PRP APPL 2/3 (GAUZE/BANDAGES/DRESSINGS) ×2 IMPLANT
CANISTER SUCTION 2500CC (MISCELLANEOUS) ×2 IMPLANT
CLIP APPLIE ROT 10 11.4 M/L (STAPLE) ×1 IMPLANT
CLOTH BEACON ORANGE TIMEOUT ST (SAFETY) ×2 IMPLANT
COVER MAYO STAND STRL (DRAPES) ×2 IMPLANT
DECANTER SPIKE VIAL GLASS SM (MISCELLANEOUS) ×2 IMPLANT
DERMABOND ADVANCED (GAUZE/BANDAGES/DRESSINGS) ×1
DERMABOND ADVANCED .7 DNX12 (GAUZE/BANDAGES/DRESSINGS) ×1 IMPLANT
DRAPE C-ARM 42X72 X-RAY (DRAPES) ×2 IMPLANT
DRAPE LAPAROSCOPIC ABDOMINAL (DRAPES) ×2 IMPLANT
ELECT REM PT RETURN 9FT ADLT (ELECTROSURGICAL) ×2
ELECTRODE REM PT RTRN 9FT ADLT (ELECTROSURGICAL) ×1 IMPLANT
GLOVE BIOGEL PI IND STRL 7.0 (GLOVE) ×1 IMPLANT
GLOVE BIOGEL PI INDICATOR 7.0 (GLOVE) ×2
GLOVE EUDERMIC 7 POWDERFREE (GLOVE) ×2 IMPLANT
GOWN STRL NON-REIN LRG LVL3 (GOWN DISPOSABLE) ×2 IMPLANT
GOWN STRL REIN XL XLG (GOWN DISPOSABLE) ×5 IMPLANT
HEMOSTAT SNOW SURGICEL 2X4 (HEMOSTASIS) ×1 IMPLANT
IV LACTATED RINGER IRRG 3000ML (IV SOLUTION) ×2
IV LR IRRIG 3000ML ARTHROMATIC (IV SOLUTION) ×1 IMPLANT
KIT BASIN OR (CUSTOM PROCEDURE TRAY) ×2 IMPLANT
NS IRRIG 1000ML POUR BTL (IV SOLUTION) ×2 IMPLANT
POUCH SPECIMEN RETRIEVAL 10MM (ENDOMECHANICALS) ×1 IMPLANT
SET CHOLANGIOGRAPH MIX (MISCELLANEOUS) ×2 IMPLANT
SET IRRIG TUBING LAPAROSCOPIC (IRRIGATION / IRRIGATOR) ×2 IMPLANT
SOLUTION ANTI FOG 6CC (MISCELLANEOUS) ×2 IMPLANT
STRIP CLOSURE SKIN 1/2X4 (GAUZE/BANDAGES/DRESSINGS) ×2 IMPLANT
SUT MNCRL AB 4-0 PS2 18 (SUTURE) ×2 IMPLANT
SYRINGE IRR TOOMEY STRL 70CC (SYRINGE) ×1 IMPLANT
TOWEL OR 17X26 10 PK STRL BLUE (TOWEL DISPOSABLE) ×4 IMPLANT
TRAY LAP CHOLE (CUSTOM PROCEDURE TRAY) ×2 IMPLANT
TROCAR BLADELESS OPT 5 75 (ENDOMECHANICALS) ×2 IMPLANT
TROCAR XCEL BLUNT TIP 100MML (ENDOMECHANICALS) ×2 IMPLANT
TROCAR XCEL NON-BLD 11X100MML (ENDOMECHANICALS) IMPLANT
TUBING INSUFFLATION 10FT LAP (TUBING) ×2 IMPLANT

## 2013-03-17 NOTE — Progress Notes (Signed)
[facility] results called to Dr. Doreen Salvage results)

## 2013-03-17 NOTE — Op Note (Signed)
Patient Name:           [name]   Date of Surgery:        03/17/2013  Pre op Diagnosis:      Chronic cholecystitis with cholelithiasis  Post op Diagnosis:  1)  Chronic cholecystitis with cholelithiasis                                     2)  Question of tiny,  solitary, distal common bile duct stone without obstruction  Procedure:                 Laparoscopic cholecystectomy with intraoperative cholangiogram  Surgeon:                     Angelia Mould. Derrell Lolling, M.D., FACS  Assistant:                      Zola Button, [place]  Operative Indications:   Julia Miranda is a 42 y.o. female. She is referred by Dr. Clinton Sawyer for evaluation and management of symptomatic gallstones. Dr. Theressa Millard is her primary care physician.  The patient has been having some mild episodes of epigastric discomfort for about 2 months. On March 29 she had a severe episode of epigastric pain that radiated to her back associated with nausea and vomiting. She took tums, but that did not help. After about 5 hours she went to the emergency room. Lab work, Including liver function tests were normal. She was discharged home and told she might have an ulcer. The pain resolved after a total of about 8 hours. She another episode about 10 days later which was also self-limited. These attacks are not necessarily related to meals. An upper GI was performed which shows moderate reflux but no ulcer. An ultrasound shows a 2.6 cm mobile gallstone, no inflammation, no ductal dilatation, small right renal cyst. She has been watching what he she eats. She is on a proton pump inhibitor.  Symptoms have subsided.  Comorbidities are minimal, and include the reflux it was in the upper GI, borderline obesity and otherwise she is healthy. She is married with 3 children    Operative Findings:      The gallbladder was thin-walled, chronically inflamed. The anatomy of the cystic duct, cystic artery and common bile duct was conventional.  Intraoperative cholangiogram showed normal intrahepatic and extrahepatic biliary anatomy, no obstruction with good flow of contrast into the duodenum, no dilatation. In the very distal common bile duct there was about a 2 mm at most filling defect that might be an air bubble or a stone. It was not causing any obstruction. This was discussed with radiology. The stomach, duodenum, small intestine, large intestine, were grossly normal to inspection.  Procedure in Detail:          Following the induction of general endotracheal anesthesia the patient's abdomen was prepped and draped in a sterile fashion, intravenous antibiotics were given, and a surgical time out was performed. 0.5% Marcaine with epinephrine was used as a local infiltration anesthetic. A vertical incision was made in the lower rim of the umbilicus. The fascia was incised in the midline and the abdominal cavity entered under direct vision. A 11 mm Hassan trocar was inserted and secured with a pursestring  suture of 0 Vicryl. Pneumoperitoneum was created, 11 mm trocar placed in the subxiphoid region  Two 5 mm trocars placed in the right upper quadrant. The gallbladder fundus was identified and elevated. I identified the infundibulum and dissected some adhesions off of that. I then incised the peritoneum medial and lateral and dissected out the cystic duct and cystic artery. I created a critical view of safety with a nice window behind the cystic duct and artery. and only 2 structures going to the gallbladder and a window behind seeing the base of the liver. The cystic artery was secured with metal clips and divided. The cholangiogram catheter was inserted into the cystic duct. A cholangiogram was obtained using the C-arm.  The cholangiogram was normal as described above with the exception of a possible, 2 mm filling defect in the distal common bile duct. This was causing no dilatation or obstruction. Considering her normal liver function test I did not  feel that we should explore the common duct at this point in time,  thinking that this may pass without symptoms. The cholangiogram catheter was removed. The cystic duct was secured with 3 hemoclips and divided. The gallbladder was dissected from its bed with  Electrocautery,placed in a specimen bag and removed. The operative field was copiously irrigated with saline. At the completion of the case there was no bleeding or bile leak anywhere in the gallbladder bed or around the area of the clips. The trocars were removed and there was no bleeding from the trocar sites. The pneumoperitoneum was released. The fascia at the umbilicus was closed with 0 Vicryl sutures and the skin closed with subcuticular sutures of 4-0 Monocryl and Dermabond. Patient tolerated she will take the recovery room in stable condition. EBL 10 cc. Counts correct. Complications none.     Angelia Mould. Derrell Lolling, M.D., FACS General and Minimally Invasive Surgery Breast and Colorectal Surgery  03/17/2013 10:06 AM

## 2013-03-17 NOTE — Anesthesia Preprocedure Evaluation (Addendum)
Anesthesia Evaluation  Patient identified by MRN, date of birth, ID band Patient awake    Reviewed: Allergy & Precautions, H&P , NPO status , Patient's Chart, lab work & pertinent test results  Airway Mallampati: II TM Distance: >3 FB Neck ROM: full    Dental no notable dental hx.    Pulmonary neg pulmonary ROS,  breath sounds clear to auscultation  Pulmonary exam normal       Cardiovascular Exercise Tolerance: Good negative cardio ROS  Rhythm:regular Rate:Normal     Neuro/Psych negative neurological ROS  negative psych ROS   GI/Hepatic negative GI ROS, Neg liver ROS,   Endo/Other  negative endocrine ROS  Renal/GU negative Renal ROS  negative genitourinary   Musculoskeletal   Abdominal   Peds  Hematology negative hematology ROS (+)   Anesthesia Other Findings   Reproductive/Obstetrics negative OB ROS                           Anesthesia Physical Anesthesia Plan  ASA: II  Anesthesia Plan: General   Post-op Pain Management:    Induction:   Airway Management Planned:   Additional Equipment:   Intra-op Plan:   Post-operative Plan:   Informed Consent: I have reviewed the patients History and Physical, chart, labs and discussed the procedure including the risks, benefits and alternatives for the proposed anesthesia with the patient or authorized representative who has indicated his/her understanding and acceptance.   Dental Advisory Given  Plan Discussed with: CRNA  Anesthesia Plan Comments:         Anesthesia Quick Evaluation

## 2013-03-17 NOTE — Interval H&P Note (Signed)
History and Physical Interval Note:  03/17/2013 8:46 AM  Julia Miranda  has presented today for surgery, with the diagnosis of gallblader  The goals and the various methods of treatment have been discussed with the patient and family. After consideration of risks, benefits and other options for treatment, the patient has consented to  Procedure(s): LAPAROSCOPIC CHOLECYSTECTOMY WITH INTRAOPERATIVE CHOLANGIOGRAM (N/A) as a surgical intervention .  The patient's history has been reviewed, patient examined, no change in status, stable for surgery.  I have reviewed the patient's chart and labs.  Questions were answered to the patient's satisfaction.     [name]

## 2013-03-17 NOTE — Anesthesia Postprocedure Evaluation (Signed)
  Anesthesia Post-op Note  Patient: Julia Miranda  Procedure(s) Performed: Procedure(s) (LRB): LAPAROSCOPIC CHOLECYSTECTOMY WITH INTRAOPERATIVE CHOLANGIOGRAM (N/A)  Patient Location: PACU  Anesthesia Type: General  Level of Consciousness: awake and alert   Airway and Oxygen Therapy: Patient Spontanous Breathing  Post-op Pain: mild  Post-op Assessment: Post-op Vital signs reviewed, Patient's Cardiovascular Status Stable, Respiratory Function Stable, Patent Airway and No signs of Nausea or vomiting  Last Vitals:  Filed Vitals:   03/17/13 1300  BP: 132/82  Pulse:   Temp:   Resp: 16    Post-op Vital Signs: stable   Complications: No apparent anesthesia complications

## 2013-03-17 NOTE — Transfer of Care (Signed)
Immediate Anesthesia Transfer of Care Note  Patient: Julia Miranda  Procedure(s) Performed: Procedure(s): LAPAROSCOPIC CHOLECYSTECTOMY WITH INTRAOPERATIVE CHOLANGIOGRAM (N/A)  Patient Location: PACU  Anesthesia Type:General  Level of Consciousness: sedated  Airway & Oxygen Therapy: Patient Spontanous Breathing and Patient connected to face mask oxygen  Post-op Assessment: Report given to PACU RN and Post -op Vital signs reviewed and stable  Post vital signs: Reviewed and stable  Complications: No apparent anesthesia complications

## 2013-03-17 NOTE — Progress Notes (Signed)
C-MET drawn by lab.

## 2013-03-18 ENCOUNTER — Encounter (HOSPITAL_COMMUNITY): Payer: Self-pay | Admitting: General Surgery

## 2013-03-31 ENCOUNTER — Encounter (INDEPENDENT_AMBULATORY_CARE_PROVIDER_SITE_OTHER): Payer: Self-pay | Admitting: General Surgery

## 2013-03-31 ENCOUNTER — Ambulatory Visit (INDEPENDENT_AMBULATORY_CARE_PROVIDER_SITE_OTHER): Payer: BC Managed Care – PPO | Admitting: General Surgery

## 2013-03-31 VITALS — BP 120/82 | HR 60 | Temp 97.6°F | Resp 18 | Ht 64.0 in | Wt 172.0 lb

## 2013-03-31 DIAGNOSIS — K8020 Calculus of gallbladder without cholecystitis without obstruction: Secondary | ICD-10-CM

## 2013-03-31 NOTE — Progress Notes (Signed)
Patient ID: Julia Miranda, female   DOB: 06-Aug-1971, 42 y.o.   MRN: [id number] History: This patient underwent laparoscopic cholecystectomy with cholangiogram on 03/17/2013. She has recovered uneventfully. She has no pain. No nausea. Normal bowel function. All of her symptoms have completely resolved and she is pleased with her progress. I discussed with her the fact that her cholangiogram showed a tiny, nonobstructing filling defect in her distal CBD. Liver function tests preop and postop were normal. I advised her that this may or may not have been a common duct stone but that this has probably passed into her intestine. She was advised to call us if she develops abdominal pain and blood work should be drawn for liver function test. I drew pictures of this and gave that to her. Her pathology report shows a single gallstone and chronic cholecystitis. I gave her a copy of the pathology report  Exam the patient looks well. Good spirits.  No distress. Abdomen soft. Nontender. All trocar sites are healing well. I removed a little bit of Dermabond from the umbilicus. No hernias.  Assessment: Chronic cholecystitis with cholelithiasis, uneventful recovery following laparoscopic cholecystectomy and cholangiogram. Question tiny non-obstructing filling defect, distal CBD on Cholangiogram. Asymptomatic. Suspect this will be a self-limited process. No intervention advised  Plan: Call if develops  abdominal pain and we will check her liver function tests Resume normal activities Low fat dietr Return to see me PRN   Angelia Mould. Derrell Lolling, M.D., [facility] General and Minimally invasive Surgery Breast and Colorectal Surgery Office:   [phone] Pager:   [phone]

## 2013-03-31 NOTE — Patient Instructions (Signed)
You have recovered from the gallbladder surgery without any apparent complications.  You may resume normal activities without restriction  Follow a low-fat diet  We talked about the slight abnormality seen on your cholangiogram, and whether this might be an air bubble or a tiny common duct stone. Since you are asymptomatic and your liver function tests were normal, I do not think anything further needs to be done. Chances are this has passed into your intestine and it will not recur. If you develop upper abdominal pain and nausea, you should have evaluation by physician and blood work for liver function tests.  Return to see Dr. Derrell Lolling if further problems arise.

## 2014-03-01 ENCOUNTER — Other Ambulatory Visit: Payer: Self-pay

## 2014-03-01 DIAGNOSIS — Z1231 Encounter for screening mammogram for malignant neoplasm of breast: Secondary | ICD-10-CM

## 2014-03-16 ENCOUNTER — Encounter (INDEPENDENT_AMBULATORY_CARE_PROVIDER_SITE_OTHER): Payer: Self-pay

## 2014-03-16 ENCOUNTER — Ambulatory Visit
Admission: RE | Admit: 2014-03-16 | Discharge: 2014-03-16 | Disposition: A | Discharge status: Home / Self Care | Payer: BC Managed Care – PPO | Source: Ambulatory Visit

## 2014-03-16 DIAGNOSIS — Z1231 Encounter for screening mammogram for malignant neoplasm of breast: Secondary | ICD-10-CM

## 2014-03-23 ENCOUNTER — Other Ambulatory Visit: Payer: Self-pay | Admitting: Dermatology

## 2014-11-28 ENCOUNTER — Ambulatory Visit
Admission: RE | Admit: 2014-11-28 | Discharge: 2014-11-28 | Disposition: A | Discharge status: Home / Self Care | Payer: BLUE CROSS/BLUE SHIELD | Source: Ambulatory Visit | Attending: Nurse Practitioner | Admitting: Nurse Practitioner

## 2014-11-28 ENCOUNTER — Other Ambulatory Visit: Payer: Self-pay | Admitting: Nurse Practitioner

## 2014-11-28 DIAGNOSIS — R591 Generalized enlarged lymph nodes: Secondary | ICD-10-CM

## 2015-05-10 ENCOUNTER — Ambulatory Visit: Payer: Self-pay | Admitting: Podiatry

## 2015-06-21 ENCOUNTER — Other Ambulatory Visit: Payer: Self-pay

## 2015-06-21 DIAGNOSIS — Z1231 Encounter for screening mammogram for malignant neoplasm of breast: Secondary | ICD-10-CM

## 2015-08-02 ENCOUNTER — Ambulatory Visit: Payer: BLUE CROSS/BLUE SHIELD

## 2015-09-04 ENCOUNTER — Other Ambulatory Visit: Payer: Self-pay | Admitting: Obstetrics and Gynecology

## 2015-09-05 ENCOUNTER — Encounter (HOSPITAL_COMMUNITY): Payer: Self-pay | Admitting: *Deleted

## 2015-09-11 ENCOUNTER — Ambulatory Visit (HOSPITAL_COMMUNITY): Payer: BLUE CROSS/BLUE SHIELD | Admitting: Anesthesiology

## 2015-09-11 ENCOUNTER — Ambulatory Visit (HOSPITAL_COMMUNITY)
Admission: RE | Admit: 2015-09-11 | Discharge: 2015-09-11 | Disposition: A | Discharge status: Home / Self Care | Payer: BLUE CROSS/BLUE SHIELD | Source: Ambulatory Visit | Attending: Obstetrics and Gynecology | Admitting: Obstetrics and Gynecology

## 2015-09-11 ENCOUNTER — Encounter (HOSPITAL_COMMUNITY): Payer: Self-pay | Admitting: *Deleted

## 2015-09-11 ENCOUNTER — Encounter (HOSPITAL_COMMUNITY)
Admission: RE | Disposition: A | Discharge status: Home / Self Care | Payer: Self-pay | Source: Ambulatory Visit | Attending: Obstetrics and Gynecology

## 2015-09-11 DIAGNOSIS — K219 Gastro-esophageal reflux disease without esophagitis: Secondary | ICD-10-CM | POA: Diagnosis not present

## 2015-09-11 DIAGNOSIS — D500 Iron deficiency anemia secondary to blood loss (chronic): Secondary | ICD-10-CM | POA: Diagnosis not present

## 2015-09-11 DIAGNOSIS — N939 Abnormal uterine and vaginal bleeding, unspecified: Secondary | ICD-10-CM | POA: Insufficient documentation

## 2015-09-11 DIAGNOSIS — N840 Polyp of corpus uteri: Secondary | ICD-10-CM | POA: Insufficient documentation

## 2015-09-11 DIAGNOSIS — N920 Excessive and frequent menstruation with regular cycle: Secondary | ICD-10-CM | POA: Diagnosis not present

## 2015-09-11 HISTORY — PX: DILITATION & CURRETTAGE/HYSTROSCOPY WITH NOVASURE ABLATION: SHX5568

## 2015-09-11 LAB — CBC
HCT: 34.7 % — ABNORMAL LOW (ref 36.0–46.0)
Hemoglobin: 11.3 g/dL — ABNORMAL LOW (ref 12.0–15.0)
MCH: 27.1 pg (ref 26.0–34.0)
MCHC: 32.6 g/dL (ref 30.0–36.0)
MCV: 83.2 fL (ref 78.0–100.0)
Platelets: 302 10*3/uL (ref 150–400)
RBC: 4.17 MIL/uL (ref 3.87–5.11)
RDW: 15.3 % (ref 11.5–15.5)
WBC: 4.8 10*3/uL (ref 4.0–10.5)

## 2015-09-11 LAB — HCG, SERUM, QUALITATIVE: Preg, Serum: NEGATIVE

## 2015-09-11 SURGERY — DILATATION & CURETTAGE/HYSTEROSCOPY WITH NOVASURE ABLATION
Anesthesia: General

## 2015-09-11 MED ORDER — MEPERIDINE HCL 25 MG/ML IJ SOLN: 6.2500 mg | INTRAMUSCULAR | Status: DC | PRN

## 2015-09-11 MED ORDER — SCOPOLAMINE 1 MG/3DAYS TD PT72
1.0000 | MEDICATED_PATCH | Freq: Once | TRANSDERMAL | Status: DC
  Administered 2015-09-11: 1.5 mg via TRANSDERMAL

## 2015-09-11 MED ORDER — PROPOFOL 10 MG/ML IV BOLUS
INTRAVENOUS | Status: AC
  Filled 2015-09-11: qty 20

## 2015-09-11 MED ORDER — FENTANYL CITRATE (PF) 100 MCG/2ML IJ SOLN
INTRAMUSCULAR | Status: DC | PRN
  Administered 2015-09-11 (×2): 50 ug via INTRAVENOUS

## 2015-09-11 MED ORDER — ONDANSETRON HCL 4 MG/2ML IJ SOLN
INTRAMUSCULAR | Status: AC
  Filled 2015-09-11: qty 2

## 2015-09-11 MED ORDER — ONDANSETRON HCL 4 MG/2ML IJ SOLN: 4.0000 mg | Freq: Once | INTRAMUSCULAR | Status: DC | PRN

## 2015-09-11 MED ORDER — ONDANSETRON HCL 4 MG/2ML IJ SOLN
INTRAMUSCULAR | Status: DC | PRN
  Administered 2015-09-11: 4 mg via INTRAVENOUS

## 2015-09-11 MED ORDER — KETOROLAC TROMETHAMINE 30 MG/ML IJ SOLN
INTRAMUSCULAR | Status: DC | PRN
  Administered 2015-09-11: 30 mg via INTRAVENOUS

## 2015-09-11 MED ORDER — SCOPOLAMINE 1 MG/3DAYS TD PT72
MEDICATED_PATCH | TRANSDERMAL | Status: AC
  Filled 2015-09-11: qty 1

## 2015-09-11 MED ORDER — MIDAZOLAM HCL 2 MG/2ML IJ SOLN
INTRAMUSCULAR | Status: AC
  Filled 2015-09-11: qty 4

## 2015-09-11 MED ORDER — SODIUM CHLORIDE 0.9 % IJ SOLN
INTRAMUSCULAR | Status: AC
  Filled 2015-09-11: qty 50

## 2015-09-11 MED ORDER — FENTANYL CITRATE (PF) 100 MCG/2ML IJ SOLN
INTRAMUSCULAR | Status: AC
  Filled 2015-09-11: qty 4

## 2015-09-11 MED ORDER — KETOROLAC TROMETHAMINE 30 MG/ML IJ SOLN
INTRAMUSCULAR | Status: AC
  Filled 2015-09-11: qty 1

## 2015-09-11 MED ORDER — BUPIVACAINE HCL (PF) 0.25 % IJ SOLN
INTRAMUSCULAR | Status: DC | PRN
  Administered 2015-09-11: 20 mL

## 2015-09-11 MED ORDER — PROPOFOL 10 MG/ML IV BOLUS
INTRAVENOUS | Status: DC | PRN
  Administered 2015-09-11: 200 mg via INTRAVENOUS

## 2015-09-11 MED ORDER — FENTANYL CITRATE (PF) 100 MCG/2ML IJ SOLN: 25.0000 ug | INTRAMUSCULAR | Status: DC | PRN

## 2015-09-11 MED ORDER — DEXAMETHASONE SODIUM PHOSPHATE 4 MG/ML IJ SOLN
INTRAMUSCULAR | Status: DC | PRN
  Administered 2015-09-11: 10 mg via INTRAVENOUS

## 2015-09-11 MED ORDER — LIDOCAINE HCL (CARDIAC) 20 MG/ML IV SOLN
INTRAVENOUS | Status: AC
  Filled 2015-09-11: qty 5

## 2015-09-11 MED ORDER — VASOPRESSIN 20 UNIT/ML IV SOLN
INTRAVENOUS | Status: AC
  Filled 2015-09-11: qty 1

## 2015-09-11 MED ORDER — VASOPRESSIN 20 UNIT/ML IV SOLN
INTRAVENOUS | Status: DC | PRN
  Administered 2015-09-11: 20 [IU] via SUBCUTANEOUS

## 2015-09-11 MED ORDER — GLYCINE 1.5 % IR SOLN
Status: DC | PRN
Start: 2015-09-11 — End: 2015-09-11
  Administered 2015-09-11: 3000 mL

## 2015-09-11 MED ORDER — MIDAZOLAM HCL 5 MG/5ML IJ SOLN
INTRAMUSCULAR | Status: DC | PRN
  Administered 2015-09-11: 2 mg via INTRAVENOUS

## 2015-09-11 MED ORDER — KETOROLAC TROMETHAMINE 30 MG/ML IJ SOLN: 30.0000 mg | Freq: Once | INTRAMUSCULAR | Status: DC | PRN

## 2015-09-11 MED ORDER — SODIUM CHLORIDE 0.9 % IJ SOLN
INTRAMUSCULAR | Status: DC | PRN
  Administered 2015-09-11: 50 mL

## 2015-09-11 MED ORDER — LIDOCAINE HCL (CARDIAC) 20 MG/ML IV SOLN
INTRAVENOUS | Status: DC | PRN
  Administered 2015-09-11: 100 mg via INTRAVENOUS

## 2015-09-11 MED ORDER — HYDROCODONE-IBUPROFEN 7.5-200 MG PO TABS: 1.0000 | ORAL_TABLET | Freq: Three times a day (TID) | ORAL | Status: DC | PRN

## 2015-09-11 MED ORDER — LACTATED RINGERS IR SOLN
Status: DC | PRN
  Administered 2015-09-11: 3000 mL

## 2015-09-11 MED ORDER — DEXAMETHASONE SODIUM PHOSPHATE 10 MG/ML IJ SOLN
INTRAMUSCULAR | Status: AC
  Filled 2015-09-11: qty 1

## 2015-09-11 MED ORDER — BUPIVACAINE HCL (PF) 0.25 % IJ SOLN
INTRAMUSCULAR | Status: AC
Start: 2015-09-11 — End: 2015-09-11
  Filled 2015-09-11: qty 30

## 2015-09-11 MED ORDER — LACTATED RINGERS IV SOLN
INTRAVENOUS | Status: DC
  Administered 2015-09-11 (×2): via INTRAVENOUS

## 2015-09-11 MED ORDER — CHLOROPROCAINE HCL 1 % IJ SOLN
INTRAMUSCULAR | Status: AC
  Filled 2015-09-11: qty 30

## 2015-09-11 SURGICAL SUPPLY — 15 items
ABLATOR ENDOMETRIAL BIPOLAR (ABLATOR) ×2 IMPLANT
CATH ROBINSON RED A/P 16FR (CATHETERS) ×2 IMPLANT
CLOTH BEACON ORANGE TIMEOUT ST (SAFETY) ×2 IMPLANT
CONTAINER PREFILL 10% NBF 60ML (FORM) ×4 IMPLANT
GLOVE BIO SURGEON STRL SZ7.5 (GLOVE) ×2 IMPLANT
GOWN STRL REUS W/TWL LRG LVL3 (GOWN DISPOSABLE) ×4 IMPLANT
LOOP ANGLED CUTTING 22FR (CUTTING LOOP) ×1 IMPLANT
PACK VAGINAL MINOR WOMEN LF (CUSTOM PROCEDURE TRAY) ×2 IMPLANT
PAD OB MATERNITY 4.3X12.25 (PERSONAL CARE ITEMS) ×2 IMPLANT
PAD PREP 24X48 CUFFED NSTRL (MISCELLANEOUS) ×2 IMPLANT
SYR TB 1ML 25GX5/8 (SYRINGE) ×2 IMPLANT
TOWEL OR 17X24 6PK STRL BLUE (TOWEL DISPOSABLE) ×4 IMPLANT
TUBING AQUILEX INFLOW (TUBING) ×2 IMPLANT
TUBING AQUILEX OUTFLOW (TUBING) ×2 IMPLANT
WATER STERILE IRR 1000ML POUR (IV SOLUTION) ×2 IMPLANT

## 2015-09-11 NOTE — Discharge Instructions (Signed)

## 2015-09-11 NOTE — Progress Notes (Signed)
Patient ID: Julia Miranda, female   DOB: February 22, 1971, 44 y.o.   MRN: [id number] Patient seen and examined. Consent witnessed and signed. No changes noted. Update completed.

## 2015-09-11 NOTE — Transfer of Care (Signed)
Immediate Anesthesia Transfer of Care Note  Patient: Julia Miranda  Procedure(s) Performed: Procedure(s): DILATATION & CURETTAGE/HYSTEROSCOPY WITH NOVASURE ABLATION/Resectoscope (N/A)  Patient Location: PACU  Anesthesia Type:General  Level of Consciousness: awake, alert  and oriented  Airway & Oxygen Therapy: Patient Spontanous Breathing and Patient connected to nasal cannula oxygen  Post-op Assessment: Report given to RN and Post -op Vital signs reviewed and stable  Post vital signs: Reviewed and stable  Last Vitals: There were no vitals filed for this visit.  Complications: No apparent anesthesia complications

## 2015-09-11 NOTE — H&P (Signed)
NAME:  [name], Roselinda               ACCOUNT NO.:  [id number]  MEDICAL RECORD NO.:  [id number]  LOCATION:  [facility]                         FACILITY:  [facility]  PHYSICIAN:  Lenoard Adenichard J. Aquil Duhe, M.D.DATE OF BIRTH:  Jan 15, 1971  DATE OF ADMISSION:  09/04/2015 DATE OF DISCHARGE:                             HISTORY & PHYSICAL   CHIEF COMPLAINT:  Abnormal uterine bleeding secondary anemia for definitive therapy.  HISTORY OF PRESENT ILLNESS:  She is a 44 year old white female, G4, P2, who presents with increasing irregular bleeding for definitive therapy.  ALLERGY:  LIVABID and CEPHALOSPORINS.  SOCIAL HISTORY:  She is a nonsmoker, nondrinker.  She denies domestic or physical violence.  FAMILY HISTORY:  Heart disease, diabetes, prostate cancer, chronic hypertension, and melanoma.  PAST SURGICAL HISTORY:  She has a personal history of cholecystectomy, 2 C-sections, and D and E for retained placenta in 2001.  PHYSICAL EXAMINATION:  GENERAL:  She is a well-developed, well- nourished, white female, in no acute distress. HEENT:  Normal. NECK:  Supple.  Full range of motion. LUNGS:  Clear. HEART:  Regular rate and rhythm. ABDOMEN:  Soft, nontender. PELVIC:  Exam revealed anteflexed uterus and no adnexal masses. EXTREMITIES:  There are no cords. NEUROLOGIC:  Nonfocal. SKIN:  Intact.  IMPRESSION:  Abnormal uterine bleeding, secondary anemia, normal endometrial biopsy for definitive therapy.  PLAN:  To proceed with diagnostic hysteroscopy, D and C, NovaSure endometrial ablation.  Risks of anesthesia, infection, bleeding with possible need for repair was discussed, delayed versus immediate complications to include bowel and bladder injury noted.  The patient acknowledges and wishes to proceed.     Lenoard Adenichard J. Jalynne Persico, M.D.     RJT/MEDQ  D:  09/11/2015  T:  09/11/2015  Job:  [id number]

## 2015-09-11 NOTE — Anesthesia Postprocedure Evaluation (Signed)
Anesthesia Post Note  Patient: Julia MuttersSharon D Mazzeo  Procedure(s) Performed: Procedure(s) (LRB): DILATATION & CURETTAGE/HYSTEROSCOPY WITH NOVASURE ABLATION/Resectoscope (N/A)  Anesthesia type: General  Patient location: PACU  Post pain: Pain level controlled  Post assessment: Post-op Vital signs reviewed  Last Vitals:  Filed Vitals:   09/11/15 1130  BP:   Pulse: 76  Temp: 36.9 C  Resp: 16    Post vital signs: Reviewed  Level of consciousness: sedated  Complications: No apparent anesthesia complications

## 2015-09-11 NOTE — Anesthesia Preprocedure Evaluation (Addendum)
Anesthesia Evaluation  Patient identified by MRN, date of birth, ID band Patient awake    Reviewed: Allergy & Precautions, H&P , NPO status , Patient's Chart, lab work & pertinent test results  Airway Mallampati: I  TM Distance: >3 FB Neck ROM: full    Dental no notable dental hx. (+) Teeth Intact   Pulmonary neg pulmonary ROS,    Pulmonary exam normal        Cardiovascular negative cardio ROS Normal cardiovascular exam Rhythm:regular     Neuro/Psych negative neurological ROS  negative psych ROS   GI/Hepatic Neg liver ROS, GERD  Medicated and Controlled,  Endo/Other  negative endocrine ROS  Renal/GU negative Renal ROS     Musculoskeletal   Abdominal Normal abdominal exam  (+)   Peds  Hematology negative hematology ROS (+)   Anesthesia Other Findings   Reproductive/Obstetrics negative OB ROS                            Anesthesia Physical Anesthesia Plan  ASA: II  Anesthesia Plan: General   Post-op Pain Management:    Induction: Intravenous  Airway Management Planned: LMA  Additional Equipment:   Intra-op Plan:   Post-operative Plan:   Informed Consent: I have reviewed the patients History and Physical, chart, labs and discussed the procedure including the risks, benefits and alternatives for the proposed anesthesia with the patient or authorized representative who has indicated his/her understanding and acceptance.     Plan Discussed with: CRNA and Surgeon  Anesthesia Plan Comments:         Anesthesia Quick Evaluation

## 2015-09-11 NOTE — Anesthesia Procedure Notes (Signed)
Procedure Name: LMA Insertion Date/Time: 09/11/2015 9:53 AM Performed by: Junious SilkGILBERT, Avelardo Reesman Pre-anesthesia Checklist: Patient identified, Suction available, Emergency Drugs available, Patient being monitored and Timeout performed Patient Re-evaluated:Patient Re-evaluated prior to inductionOxygen Delivery Method: Circle system utilized Preoxygenation: Pre-oxygenation with 100% oxygen Intubation Type: IV induction Ventilation: Mask ventilation without difficulty LMA: LMA inserted LMA Size: 4.0 Number of attempts: 1 Placement Confirmation: positive ETCO2,  CO2 detector and breath sounds checked- equal and bilateral Tube secured with: Tape Dental Injury: Teeth and Oropharynx as per pre-operative assessment

## 2015-09-11 NOTE — Op Note (Signed)
09/11/2015  11:00 AM  PATIENT:  Julia Miranda  44 y.o. female  PRE-OPERATIVE DIAGNOSIS:  abnormal uterine bleeding, menorrhagia  POST-OPERATIVE DIAGNOSIS:  abnormal uterine bleeding, menorrhagia  PROCEDURE:  Procedure(s): DILATATION & CURETTAGE HYSTEROSCOPY WITH NOVASURE ABLATION /Resectoscopic polypectomy  SURGEON:  Surgeon(s): Olivia Mackieichard Jaeden Messer, MD  ASSISTANTS: none   ANESTHESIA:   local and general  ESTIMATED BLOOD LOSS: * No blood loss amount entered *   DRAINS: none   LOCAL MEDICATIONS USED:  MARCAINE    and Amount: 20 ml  SPECIMEN:  Source of Specimen:  EMC and polyp  DISPOSITION OF SPECIMEN:  PATHOLOGY  COUNTS:  YES  DICTATION #: [id number]  PLAN OF CARE: dc home  PATIENT DISPOSITION:  PACU - hemodynamically stable.

## 2015-09-12 ENCOUNTER — Encounter (HOSPITAL_COMMUNITY): Payer: Self-pay | Admitting: Obstetrics and Gynecology

## 2015-09-12 NOTE — Op Note (Signed)
NAME:  [name], Julia Miranda               ACCOUNT NO.:  [id number]  MEDICAL RECORD NO.:  [id number]  LOCATION:  [facility]                          FACILITY:  [facility]  PHYSICIAN:  Lenoard Adenichard J. Lillianah Swartzentruber, M.D.DATE OF BIRTH:  [birth year]  DATE OF PROCEDURE:  09/11/2015 DATE OF DISCHARGE:  09/11/2015                              OPERATIVE REPORT   PREOPERATIVE DIAGNOSES:  Menometrorrhagia with normal endometrial biopsy, secondary anemia.  POSTOPERATIVE DIAGNOSES:  Menometrorrhagia with normal endometrial biopsy, secondary anemia plus endometrial polyp.  PROCEDURE:  Diagnostic hysteroscopy, D and C, NovaSure endometrial ablation, Resectoscopic polypectomies.  SURGEON:  Lenoard Adenichard J. Nehan Flaum, M.D.  ASSISTANT:  None.  ANESTHESIA:  General and local.  ESTIMATED BLOOD LOSS:  Less than 50 mL.  FLUID DEFICIT:  300 mL.  COMPLICATIONS:  None.  DRAINS:  None.  COUNTS:  Correct.  DISPOSITION:  The patient to recovery in good condition.  BRIEF OPERATIVE NOTE:  After being apprised of the risks of anesthesia, infection, bleeding, injury to surrounding organs, possible need for repair, delayed versus immediate complications to include bowel and bladder injury, possible need for repair, the patient was brought to the operating room, where she was administered general anesthetic without complications.  Prepped and draped in usual sterile fashion.  Feet were placed in Yellofin stirrups.  Exam under anesthesia revealed a bulky anteflexed uterus and no adnexal masses.  At this time, dilute Pitressin solution placed at 3 and 9 o'clock standard block using 20 mL total, cervix easily dilated up to a #29 Pratt dilator.  Hysteroscope placed. Visualization revealed a fundal and posterior and lateral wall endometrial polyp, all resected using right-angle loop sent to Pathology.  Endometrial curettings were collected in the usual fashion using sharp curettage in a 4-quadrant method.  Repeat hysteroscopy revealed the  cavity to be otherwise normal.  At this time, NovaSure device was placed, seated to a length of 6.5, a width of 4.3, and initiated after negative CO2 test for 70 seconds.  The device was then removed, inspected, and found to be intact.  Revisualization hysteroscopically reveals no evidence of uterine perforation.  A well-ablated endometrial cavity. Good hemostasis was noted.  All instruments were removed.  Dilute Marcaine solution placed, 20 mL total, standard paracervical block.  The patient tolerated the procedure well.  Transferred to recovery in good condition.     Lenoard Adenichard J. Lesa Vandall, M.D.     RJT/MEDQ  D:  09/11/2015  T:  09/12/2015  Job:  [id number]

## 2016-04-25 DIAGNOSIS — D2261 Melanocytic nevi of right upper limb, including shoulder: Secondary | ICD-10-CM | POA: Diagnosis not present

## 2016-04-25 DIAGNOSIS — D2262 Melanocytic nevi of left upper limb, including shoulder: Secondary | ICD-10-CM | POA: Diagnosis not present

## 2016-04-25 DIAGNOSIS — Z808 Family history of malignant neoplasm of other organs or systems: Secondary | ICD-10-CM | POA: Diagnosis not present

## 2016-04-25 DIAGNOSIS — L821 Other seborrheic keratosis: Secondary | ICD-10-CM | POA: Diagnosis not present

## 2016-06-18 DIAGNOSIS — R202 Paresthesia of skin: Secondary | ICD-10-CM | POA: Diagnosis not present

## 2016-06-24 DIAGNOSIS — H5213 Myopia, bilateral: Secondary | ICD-10-CM | POA: Diagnosis not present

## 2016-07-28 DIAGNOSIS — Z23 Encounter for immunization: Secondary | ICD-10-CM | POA: Diagnosis not present

## 2016-07-28 DIAGNOSIS — M25552 Pain in left hip: Secondary | ICD-10-CM | POA: Diagnosis not present

## 2016-07-28 DIAGNOSIS — Z0000 Encounter for general adult medical examination without abnormal findings: Secondary | ICD-10-CM | POA: Diagnosis not present

## 2016-07-28 DIAGNOSIS — K219 Gastro-esophageal reflux disease without esophagitis: Secondary | ICD-10-CM | POA: Diagnosis not present

## 2016-07-28 DIAGNOSIS — E7800 Pure hypercholesterolemia, unspecified: Secondary | ICD-10-CM | POA: Diagnosis not present

## 2016-09-10 DIAGNOSIS — Z23 Encounter for immunization: Secondary | ICD-10-CM | POA: Diagnosis not present

## 2016-10-24 DIAGNOSIS — J209 Acute bronchitis, unspecified: Secondary | ICD-10-CM | POA: Diagnosis not present

## 2016-10-28 DIAGNOSIS — J209 Acute bronchitis, unspecified: Secondary | ICD-10-CM | POA: Diagnosis not present

## 2017-02-03 DIAGNOSIS — H6983 Other specified disorders of Eustachian tube, bilateral: Secondary | ICD-10-CM | POA: Diagnosis not present

## 2017-02-03 DIAGNOSIS — H93A9 Pulsatile tinnitus, unspecified ear: Secondary | ICD-10-CM | POA: Diagnosis not present

## 2017-02-03 DIAGNOSIS — H9202 Otalgia, left ear: Secondary | ICD-10-CM | POA: Diagnosis not present

## 2017-03-03 DIAGNOSIS — H6982 Other specified disorders of Eustachian tube, left ear: Secondary | ICD-10-CM | POA: Diagnosis not present

## 2017-03-03 DIAGNOSIS — H93A9 Pulsatile tinnitus, unspecified ear: Secondary | ICD-10-CM | POA: Diagnosis not present

## 2017-03-20 ENCOUNTER — Other Ambulatory Visit: Payer: Self-pay | Admitting: Otolaryngology

## 2017-03-20 DIAGNOSIS — H93A9 Pulsatile tinnitus, unspecified ear: Secondary | ICD-10-CM

## 2017-03-24 ENCOUNTER — Other Ambulatory Visit: Payer: Self-pay | Admitting: Obstetrics and Gynecology

## 2017-03-24 ENCOUNTER — Ambulatory Visit
Admission: RE | Admit: 2017-03-24 | Discharge: 2017-03-24 | Disposition: A | Discharge status: Home / Self Care | Payer: BLUE CROSS/BLUE SHIELD | Source: Ambulatory Visit | Attending: Otolaryngology | Admitting: Otolaryngology

## 2017-03-24 DIAGNOSIS — Z1231 Encounter for screening mammogram for malignant neoplasm of breast: Secondary | ICD-10-CM

## 2017-03-24 DIAGNOSIS — H9319 Tinnitus, unspecified ear: Secondary | ICD-10-CM | POA: Diagnosis not present

## 2017-03-24 DIAGNOSIS — H93A9 Pulsatile tinnitus, unspecified ear: Secondary | ICD-10-CM

## 2017-04-15 ENCOUNTER — Ambulatory Visit
Admission: RE | Admit: 2017-04-15 | Discharge: 2017-04-15 | Disposition: A | Discharge status: Home / Self Care | Payer: BLUE CROSS/BLUE SHIELD | Source: Ambulatory Visit | Attending: Obstetrics and Gynecology | Admitting: Obstetrics and Gynecology

## 2017-04-15 DIAGNOSIS — Z1231 Encounter for screening mammogram for malignant neoplasm of breast: Secondary | ICD-10-CM

## 2017-05-28 DIAGNOSIS — H5213 Myopia, bilateral: Secondary | ICD-10-CM | POA: Diagnosis not present

## 2017-06-02 DIAGNOSIS — S29011A Strain of muscle and tendon of front wall of thorax, initial encounter: Secondary | ICD-10-CM | POA: Diagnosis not present

## 2017-06-26 DIAGNOSIS — M205X1 Other deformities of toe(s) (acquired), right foot: Secondary | ICD-10-CM | POA: Diagnosis not present

## 2017-06-30 DIAGNOSIS — D2261 Melanocytic nevi of right upper limb, including shoulder: Secondary | ICD-10-CM | POA: Diagnosis not present

## 2017-06-30 DIAGNOSIS — Z808 Family history of malignant neoplasm of other organs or systems: Secondary | ICD-10-CM | POA: Diagnosis not present

## 2017-06-30 DIAGNOSIS — L821 Other seborrheic keratosis: Secondary | ICD-10-CM | POA: Diagnosis not present

## 2017-06-30 DIAGNOSIS — D225 Melanocytic nevi of trunk: Secondary | ICD-10-CM | POA: Diagnosis not present

## 2017-07-15 DIAGNOSIS — M205X1 Other deformities of toe(s) (acquired), right foot: Secondary | ICD-10-CM | POA: Diagnosis not present

## 2017-07-23 DIAGNOSIS — M205X1 Other deformities of toe(s) (acquired), right foot: Secondary | ICD-10-CM | POA: Diagnosis not present

## 2017-07-23 DIAGNOSIS — M21611 Bunion of right foot: Secondary | ICD-10-CM | POA: Diagnosis not present

## 2017-08-04 DIAGNOSIS — Z0000 Encounter for general adult medical examination without abnormal findings: Secondary | ICD-10-CM | POA: Diagnosis not present

## 2017-08-04 DIAGNOSIS — Z6835 Body mass index (BMI) 35.0-35.9, adult: Secondary | ICD-10-CM | POA: Diagnosis not present

## 2017-08-04 DIAGNOSIS — E7800 Pure hypercholesterolemia, unspecified: Secondary | ICD-10-CM | POA: Diagnosis not present

## 2017-08-04 DIAGNOSIS — K219 Gastro-esophageal reflux disease without esophagitis: Secondary | ICD-10-CM | POA: Diagnosis not present

## 2017-08-11 DIAGNOSIS — M205X1 Other deformities of toe(s) (acquired), right foot: Secondary | ICD-10-CM | POA: Diagnosis not present

## 2017-09-01 DIAGNOSIS — Z23 Encounter for immunization: Secondary | ICD-10-CM | POA: Diagnosis not present

## 2017-11-13 DIAGNOSIS — B373 Candidiasis of vulva and vagina: Secondary | ICD-10-CM | POA: Diagnosis not present

## 2017-11-17 DIAGNOSIS — R300 Dysuria: Secondary | ICD-10-CM | POA: Diagnosis not present

## 2017-11-18 DIAGNOSIS — M722 Plantar fascial fibromatosis: Secondary | ICD-10-CM | POA: Diagnosis not present

## 2017-12-19 DIAGNOSIS — J0110 Acute frontal sinusitis, unspecified: Secondary | ICD-10-CM | POA: Diagnosis not present

## 2017-12-19 DIAGNOSIS — H9209 Otalgia, unspecified ear: Secondary | ICD-10-CM | POA: Diagnosis not present

## 2017-12-24 DIAGNOSIS — J0100 Acute maxillary sinusitis, unspecified: Secondary | ICD-10-CM | POA: Diagnosis not present

## 2017-12-31 DIAGNOSIS — Z1329 Encounter for screening for other suspected endocrine disorder: Secondary | ICD-10-CM | POA: Diagnosis not present

## 2017-12-31 DIAGNOSIS — Z01419 Encounter for gynecological examination (general) (routine) without abnormal findings: Secondary | ICD-10-CM | POA: Diagnosis not present

## 2017-12-31 DIAGNOSIS — Z6835 Body mass index (BMI) 35.0-35.9, adult: Secondary | ICD-10-CM | POA: Diagnosis not present

## 2017-12-31 DIAGNOSIS — Z0000 Encounter for general adult medical examination without abnormal findings: Secondary | ICD-10-CM | POA: Diagnosis not present

## 2017-12-31 DIAGNOSIS — Z13220 Encounter for screening for lipoid disorders: Secondary | ICD-10-CM | POA: Diagnosis not present

## 2017-12-31 DIAGNOSIS — Z131 Encounter for screening for diabetes mellitus: Secondary | ICD-10-CM | POA: Diagnosis not present

## 2017-12-31 DIAGNOSIS — N938 Other specified abnormal uterine and vaginal bleeding: Secondary | ICD-10-CM | POA: Diagnosis not present

## 2017-12-31 DIAGNOSIS — Z130 Encounter for screening for diseases of the blood and blood-forming organs and certain disorders involving the immune mechanism: Secondary | ICD-10-CM | POA: Diagnosis not present

## 2018-06-22 DIAGNOSIS — Z808 Family history of malignant neoplasm of other organs or systems: Secondary | ICD-10-CM | POA: Diagnosis not present

## 2018-06-22 DIAGNOSIS — D2261 Melanocytic nevi of right upper limb, including shoulder: Secondary | ICD-10-CM | POA: Diagnosis not present

## 2018-06-22 DIAGNOSIS — L821 Other seborrheic keratosis: Secondary | ICD-10-CM | POA: Diagnosis not present

## 2018-06-22 DIAGNOSIS — D225 Melanocytic nevi of trunk: Secondary | ICD-10-CM | POA: Diagnosis not present

## 2018-07-23 DIAGNOSIS — H5213 Myopia, bilateral: Secondary | ICD-10-CM | POA: Diagnosis not present

## 2018-08-17 DIAGNOSIS — E7800 Pure hypercholesterolemia, unspecified: Secondary | ICD-10-CM | POA: Diagnosis not present

## 2018-08-17 DIAGNOSIS — E669 Obesity, unspecified: Secondary | ICD-10-CM | POA: Diagnosis not present

## 2018-08-17 DIAGNOSIS — K219 Gastro-esophageal reflux disease without esophagitis: Secondary | ICD-10-CM | POA: Diagnosis not present

## 2018-08-17 DIAGNOSIS — Z0000 Encounter for general adult medical examination without abnormal findings: Secondary | ICD-10-CM | POA: Diagnosis not present

## 2018-10-04 DIAGNOSIS — J019 Acute sinusitis, unspecified: Secondary | ICD-10-CM | POA: Diagnosis not present

## 2018-11-18 DIAGNOSIS — J040 Acute laryngitis: Secondary | ICD-10-CM | POA: Diagnosis not present

## 2018-11-18 DIAGNOSIS — J069 Acute upper respiratory infection, unspecified: Secondary | ICD-10-CM | POA: Diagnosis not present

## 2018-11-18 DIAGNOSIS — H6990 Unspecified Eustachian tube disorder, unspecified ear: Secondary | ICD-10-CM | POA: Diagnosis not present

## 2018-11-26 DIAGNOSIS — J0100 Acute maxillary sinusitis, unspecified: Secondary | ICD-10-CM | POA: Diagnosis not present

## 2018-11-26 DIAGNOSIS — J209 Acute bronchitis, unspecified: Secondary | ICD-10-CM | POA: Diagnosis not present

## 2019-03-30 DIAGNOSIS — Z808 Family history of malignant neoplasm of other organs or systems: Secondary | ICD-10-CM | POA: Diagnosis not present

## 2019-03-30 DIAGNOSIS — D2372 Other benign neoplasm of skin of left lower limb, including hip: Secondary | ICD-10-CM | POA: Diagnosis not present

## 2019-03-30 DIAGNOSIS — D2261 Melanocytic nevi of right upper limb, including shoulder: Secondary | ICD-10-CM | POA: Diagnosis not present

## 2019-03-30 DIAGNOSIS — D485 Neoplasm of uncertain behavior of skin: Secondary | ICD-10-CM | POA: Diagnosis not present

## 2019-03-30 DIAGNOSIS — D225 Melanocytic nevi of trunk: Secondary | ICD-10-CM | POA: Diagnosis not present

## 2019-04-28 DIAGNOSIS — M7522 Bicipital tendinitis, left shoulder: Secondary | ICD-10-CM | POA: Diagnosis not present

## 2019-04-28 DIAGNOSIS — I10 Essential (primary) hypertension: Secondary | ICD-10-CM | POA: Diagnosis not present

## 2019-04-28 DIAGNOSIS — F439 Reaction to severe stress, unspecified: Secondary | ICD-10-CM | POA: Diagnosis not present

## 2019-06-06 ENCOUNTER — Other Ambulatory Visit: Payer: Self-pay | Admitting: Family Medicine

## 2019-06-06 DIAGNOSIS — R1033 Periumbilical pain: Secondary | ICD-10-CM

## 2019-06-06 DIAGNOSIS — I10 Essential (primary) hypertension: Secondary | ICD-10-CM | POA: Diagnosis not present

## 2019-06-09 ENCOUNTER — Other Ambulatory Visit: Payer: Self-pay | Admitting: Family Medicine

## 2019-06-20 ENCOUNTER — Other Ambulatory Visit: Payer: Self-pay | Admitting: Family Medicine

## 2019-06-20 ENCOUNTER — Ambulatory Visit
Admission: RE | Admit: 2019-06-20 | Discharge: 2019-06-20 | Disposition: A | Discharge status: Home / Self Care | Payer: BLUE CROSS/BLUE SHIELD | Source: Ambulatory Visit | Attending: Family Medicine | Admitting: Family Medicine

## 2019-06-20 DIAGNOSIS — R1033 Periumbilical pain: Secondary | ICD-10-CM

## 2019-06-20 DIAGNOSIS — N281 Cyst of kidney, acquired: Secondary | ICD-10-CM | POA: Diagnosis not present

## 2019-06-20 DIAGNOSIS — N200 Calculus of kidney: Secondary | ICD-10-CM | POA: Diagnosis not present

## 2019-06-20 DIAGNOSIS — K439 Ventral hernia without obstruction or gangrene: Secondary | ICD-10-CM | POA: Diagnosis not present

## 2019-06-20 MED ORDER — IOPAMIDOL (ISOVUE-300) INJECTION 61%
100.0000 mL | Freq: Once | INTRAVENOUS | Status: AC | PRN
  Administered 2019-06-20: 100 mL via INTRAVENOUS

## 2019-07-18 DIAGNOSIS — J309 Allergic rhinitis, unspecified: Secondary | ICD-10-CM | POA: Diagnosis not present

## 2019-07-25 DIAGNOSIS — B001 Herpesviral vesicular dermatitis: Secondary | ICD-10-CM | POA: Diagnosis not present

## 2019-08-25 ENCOUNTER — Encounter: Payer: Self-pay | Admitting: General Surgery

## 2019-08-25 ENCOUNTER — Other Ambulatory Visit: Payer: Self-pay | Admitting: General Surgery

## 2019-08-25 DIAGNOSIS — Z9049 Acquired absence of other specified parts of digestive tract: Secondary | ICD-10-CM | POA: Diagnosis not present

## 2019-08-25 DIAGNOSIS — Z6835 Body mass index (BMI) 35.0-35.9, adult: Secondary | ICD-10-CM | POA: Diagnosis not present

## 2019-08-25 DIAGNOSIS — K432 Incisional hernia without obstruction or gangrene: Secondary | ICD-10-CM | POA: Diagnosis not present

## 2019-08-25 DIAGNOSIS — Z98891 History of uterine scar from previous surgery: Secondary | ICD-10-CM | POA: Diagnosis not present

## 2019-08-29 ENCOUNTER — Other Ambulatory Visit: Payer: Self-pay | Admitting: Obstetrics and Gynecology

## 2019-08-29 DIAGNOSIS — Z1231 Encounter for screening mammogram for malignant neoplasm of breast: Secondary | ICD-10-CM

## 2019-08-30 NOTE — Progress Notes (Signed)
[id number] [place], [place] [address] [place]rea Mat[place] [address] Phone: [phone] Fax: [phone]      Your procedure is scheduled on October 28  Report to [facility] Main Entrance "A" at 1030 A.M., and check in at the Admitting office.  Call this number if you have problems the morning of surgery:  [phone]  Call [phone] if you have any questions prior to your surgery date Monday-Friday 8am-4pm    Remember:  Do not eat after midnight the night before your surgery  You may drink clear liquids until 0930 am  the morning of your surgery.   Clear liquids allowed are: Water, Non-Citrus Juices (without pulp), Carbonated Beverages, Clear Tea, Black Coffee Only, and Gatorade    Take these medicines the morning of surgery with A SIP OF WATER  fexofenadine (ALLEGRA) fluticasone (FLONASE) pantoprazole (PROTONIX)  7 days prior to surgery STOP taking any Aspirin (unless otherwise instructed by your surgeon), Aleve, Naproxen, Ibuprofen, Motrin, Advil, Goody's, BC's, all herbal medications, fish oil, and all vitamins.    The Morning of Surgery  Do not wear jewelry, make-up or nail polish.  Do not wear lotions, powders, or perfumes/colognes, or deodorant  Do not shave 48 hours prior to surgery.  Men may shave face and neck.  Do not bring valuables to the hospital.  [facility] is not responsible for any belongings or valuables.  If you are a smoker, DO NOT Smoke 24 hours prior to surgery IF you wear a CPAP at night please bring your mask, tubing, and machine the morning of surgery   Remember that you must have someone to transport you home after your surgery, and remain with you for 24 hours if you are discharged the same day.   Contacts, glasses, hearing aids, dentures or bridgework may not be worn into surgery.    Leave your suitcase in the car.  After surgery it may be brought to  your room.  For patients admitted to the hospital, discharge time will be determined by your treatment team.  Patients discharged the day of surgery will not be allowed to drive home.    Special instructions:   [facility]- Preparing For Surgery  Before surgery, you can play an important role. Because skin is not sterile, your skin needs to be as free of germs as possible. You can reduce the number of germs on your skin by washing with CHG (chlorahexidine gluconate) Soap before surgery.  CHG is an antiseptic cleaner which kills germs and bonds with the skin to continue killing germs even after washing.    Oral Hygiene is also important to reduce your risk of infection.  Remember - BRUSH YOUR TEETH THE MORNING OF SURGERY WITH YOUR REGULAR TOOTHPASTE  Please do not use if you have an allergy to CHG or antibacterial soaps. If your skin becomes reddened/irritated stop using the CHG.  Do not shave (including legs and underarms) for at least 48 hours prior to first CHG shower. It is OK to shave your face.  Please follow these instructions carefully.   1. Shower the NIGHT BEFORE SURGERY and the MORNING OF SURGERY with CHG Soap.   2. If you chose to wash your hair, wash your hair first as usual with your normal shampoo.  3. After you shampoo, rinse your hair and body thoroughly to remove the shampoo.  4. Use CHG as you would any other liquid soap.  You can apply CHG directly to the skin and wash gently with a scrungie or a clean washcloth.   5. Apply the CHG Soap to your body ONLY FROM THE NECK DOWN.  Do not use on open wounds or open sores. Avoid contact with your eyes, ears, mouth and genitals (private parts). Wash Face and genitals (private parts)  with your normal soap.   6. Wash thoroughly, paying special attention to the area where your surgery will be performed.  7. Thoroughly rinse your body with warm water from the neck down.  8. DO NOT shower/wash with your normal soap after using  and rinsing off the CHG Soap.  9. Pat yourself dry with a CLEAN TOWEL.  10. Wear CLEAN PAJAMAS to bed the night before surgery, wear comfortable clothes the morning of surgery  11. Place CLEAN SHEETS on your bed the night of your first shower and DO NOT SLEEP WITH PETS.    Day of Surgery:  Do not apply any deodorants/lotions. Please shower the morning of surgery with the CHG soap  Please wear clean clothes to the hospital/surgery center.   Remember to brush your teeth WITH YOUR REGULAR TOOTHPASTE.   Please read over the following fact sheets that you were given.

## 2019-08-31 ENCOUNTER — Encounter (HOSPITAL_COMMUNITY): Payer: Self-pay

## 2019-08-31 ENCOUNTER — Encounter (HOSPITAL_COMMUNITY)
Admission: RE | Admit: 2019-08-31 | Discharge: 2019-08-31 | Disposition: A | Discharge status: Home / Self Care | Payer: BC Managed Care – PPO | Source: Ambulatory Visit | Attending: General Surgery | Admitting: General Surgery

## 2019-08-31 ENCOUNTER — Other Ambulatory Visit: Payer: Self-pay

## 2019-08-31 DIAGNOSIS — Z01818 Encounter for other preprocedural examination: Secondary | ICD-10-CM | POA: Insufficient documentation

## 2019-08-31 DIAGNOSIS — I10 Essential (primary) hypertension: Secondary | ICD-10-CM | POA: Diagnosis not present

## 2019-08-31 DIAGNOSIS — K432 Incisional hernia without obstruction or gangrene: Secondary | ICD-10-CM | POA: Insufficient documentation

## 2019-08-31 HISTORY — DX: Essential (primary) hypertension: I10

## 2019-08-31 LAB — CBC WITH DIFFERENTIAL/PLATELET
Abs Immature Granulocytes: 0.03 10*3/uL (ref 0.00–0.07)
Basophils Absolute: 0.1 10*3/uL (ref 0.0–0.1)
Basophils Relative: 1 %
Eosinophils Absolute: 0.1 10*3/uL (ref 0.0–0.5)
Eosinophils Relative: 2 %
HCT: 42.1 % (ref 36.0–46.0)
Hemoglobin: 14.2 g/dL (ref 12.0–15.0)
Immature Granulocytes: 0 %
Lymphocytes Relative: 26 %
Lymphs Abs: 2.1 10*3/uL (ref 0.7–4.0)
MCH: 29.7 pg (ref 26.0–34.0)
MCHC: 33.7 g/dL (ref 30.0–36.0)
MCV: 88.1 fL (ref 80.0–100.0)
Monocytes Absolute: 0.4 10*3/uL (ref 0.1–1.0)
Monocytes Relative: 5 %
Neutro Abs: 5.3 10*3/uL (ref 1.7–7.7)
Neutrophils Relative %: 66 %
Platelets: 326 10*3/uL (ref 150–400)
RBC: 4.78 MIL/uL (ref 3.87–5.11)
RDW: 14.6 % (ref 11.5–15.5)
WBC: 8.1 10*3/uL (ref 4.0–10.5)
nRBC: 0 % (ref 0.0–0.2)

## 2019-08-31 LAB — COMPREHENSIVE METABOLIC PANEL
ALT: 33 U/L (ref 0–44)
AST: 22 U/L (ref 15–41)
Albumin: 4 g/dL (ref 3.5–5.0)
Alkaline Phosphatase: 71 U/L (ref 38–126)
Anion gap: 11 (ref 5–15)
BUN: 12 mg/dL (ref 6–20)
CO2: 24 mmol/L (ref 22–32)
Calcium: 9.8 mg/dL (ref 8.9–10.3)
Chloride: 105 mmol/L (ref 98–111)
Creatinine, Ser: 0.83 mg/dL (ref 0.44–1.00)
GFR calc Af Amer: >60 mL/min (ref >60)
GFR calc non Af Amer: >60 mL/min (ref >60)
Glucose, Bld: 120 mg/dL — ABNORMAL HIGH (ref 70–99)
Potassium: 3.9 mmol/L (ref 3.5–5.1)
Sodium: 140 mmol/L (ref 135–145)
Total Bilirubin: 0.6 mg/dL (ref 0.3–1.2)
Total Protein: 6.9 g/dL (ref 6.5–8.1)

## 2019-08-31 NOTE — Progress Notes (Signed)
PCP:  Carol Ada, MD Cardiologist:  Denies  EKG:  08/31/19 CXR:  N/A ECHO:  denies Stress Test:  denies Cardiac Cath:  Denies  Covid testing scheduled for September 03, 2019   Patient denies shortness of breath, fever, cough, and chest pain at PAT appointment.  Patient verbalized understanding of instructions provided today at the PAT appointment.  Patient asked to review instructions at home and day of surgery.

## 2019-09-01 NOTE — H&P (Signed)
Julia Miranda  Location: [facility] Patient #: [id number] DOB: [birth year] Married / Language: English / Race: White Female       History of Present Illness  This is a very pleasant 48 year old female. Former patient of mine. She is referred by Merri Brunette for a periumbilical hernia with pain.      I performed a laparoscopic cholecystectomy in 2014. She's had 2 C-sections. This year she has noticed some pain and a bulge just above and to the left of her umbilicus. She's had a couple of episodes where this was fairly severe. She had nausea one time when she was overactive but no vomiting. A CT scan has been done and shows a small supraumbilical hernia but no entrapped contents. Nonobstructing left renal stone. Otherwise CT normal. She's having a lot of pain and wants to have something done. She also wants to be recovered from surgery by Thanksgiving when her children, home from college. I told her that I was retiring in January and that if she wanted me to do the surgery I would like to do it as soon as possible so that I could supervise her recovery for 4-6 weeks. That is what she wants to do. She does not want to transfer her care to another surgeon. Other than her pain, there is no emergent indication for surgery. She says the pain is bad enough to go ahead. She is also aware that over time the hernia will likely enlarge and become more symptomatic and she would like to avoid progression as well.      Past history significant for C-section 2, laparoscopic cholecystectomy. GERD. Recently started on medication for hypertension. BMI 35. Family history noncontributory Social history reveals she is married. She works full-time as an Airline pilot. She had a son that was married in September. He was very active at that celebration and had more pain during that time. Denies alcohol or tobacco. 3 children      She'll be scheduled for laparoscopic repair of  periumbilical incisional hernia with inlay mesh. I discussed the indications, details, techniques, and numerous risk of surgery with her. She is aware the risks of bleeding, infection, chronic pain, recurrence of the hernia, injury to adjacent organs, major laparotomy with reconstruction, conversion to open laparotomy if adhesions are bad. She understands all these issues well. All of her questions were answered. She agrees with this plan.      Past Surgical History Cesarean Section - Multiple  Foot Surgery  Right. Gallbladder Surgery - Laparoscopic  Oral Surgery   Diagnostic Studies History  Colonoscopy  never Mammogram  1-3 years ago Pap Smear  1-5 years ago  Allergies  No Known Allergies  No Known Drug Allergies  Allergies Reconciled  Cephalosporins  Hives.  Medication History  hydroCHLOROthiazide (12.5MG  Capsule, Oral) Active. Pantoprazole Sodium (40MG  Tablet DR, Oral) Active. valACYclovir HCl (1GM Tablet, Oral) Active. Flonase Allergy Relief (50MCG/ACT Suspension, Nasal) Active. Probiotic (Oral) Active. Medications Reconciled  Social History  Caffeine use  Carbonated beverages, Tea. No alcohol use  No drug use  Tobacco use  Never smoker.  Family History Arthritis  Mother. Diabetes Mellitus  Father. Hypertension  Father, Mother. Melanoma  Father. Prostate Cancer  Father.  Pregnancy / Birth History  Age at menarche  12 years. Contraceptive History  Oral contraceptives. Gravida  4 Maternal age  59-30 Para  3  Other Problems  Cholelithiasis  Gastroesophageal Reflux Disease  High blood pressure  Umbilical Hernia Repair  Review of Systems  General Not Present- Appetite Loss, Chills, Fatigue, Fever, Night Sweats, Weight Gain and Weight Loss. Skin Not Present- Change in Wart/Mole, Dryness, Hives, Jaundice, New Lesions, Non-Healing Wounds, Rash and Ulcer. HEENT Present- Seasonal Allergies and Wears glasses/contact  lenses. Not Present- Earache, Hearing Loss, Hoarseness, Nose Bleed, Oral Ulcers, Ringing in the Ears, Sinus Pain, Sore Throat, Visual Disturbances and Yellow Eyes. Respiratory Not Present- Bloody sputum, Chronic Cough, Difficulty Breathing, Snoring and Wheezing. Breast Not Present- Breast Mass, Breast Pain, Nipple Discharge and Skin Changes. Cardiovascular Not Present- Chest Pain, Difficulty Breathing Lying Down, Leg Cramps, Palpitations, Rapid Heart Rate, Shortness of Breath and Swelling of Extremities. Gastrointestinal Present- Abdominal Pain. Not Present- Bloating, Bloody Stool, Change in Bowel Habits, Chronic diarrhea, Constipation, Difficulty Swallowing, Excessive gas, Gets full quickly at meals, Hemorrhoids, Indigestion, Nausea, Rectal Pain and Vomiting. Female Genitourinary Not Present- Frequency, Nocturia, Painful Urination, Pelvic Pain and Urgency. Musculoskeletal Not Present- Back Pain, Joint Pain, Joint Stiffness, Muscle Pain, Muscle Weakness and Swelling of Extremities. Neurological Not Present- Decreased Memory, Fainting, Headaches, Numbness, Seizures, Tingling, Tremor, Trouble walking and Weakness. Psychiatric Not Present- Anxiety, Bipolar, Change in Sleep Pattern, Depression, Fearful and Frequent crying. Endocrine Not Present- Cold Intolerance, Excessive Hunger, Hair Changes, Heat Intolerance, Hot flashes and New Diabetes. Hematology Not Present- Blood Thinners, Easy Bruising, Excessive bleeding, Gland problems, HIV and Persistent Infections.  Vitals Weight: 203.6 lb Height: 64in Body Surface Area: 1.97 m Body Mass Index: 34.95 kg/m  Temp.: 97.60F(Temporal)  Pulse: 97 (Regular)  BP: 142/88 (Sitting, Left Arm, Standard)       Physical Exam  General Mental Status-Alert. General Appearance-Consistent with stated age. Hydration-Well hydrated. Voice-Normal. Note: Very pleasant. Friendly. Cooperative. Very talkative.   Head and  Neck Head-normocephalic, atraumatic with no lesions or palpable masses. Trachea-midline. Thyroid Gland Characteristics - normal size and consistency.  Eye Eyeball - Bilateral-Extraocular movements intact. Sclera/Conjunctiva - Bilateral-No scleral icterus.  Chest and Lung Exam Chest and lung exam reveals -quiet, even and easy respiratory effort with no use of accessory muscles and on auscultation, normal breath sounds, no adventitious sounds and normal vocal resonance. Inspection Chest Wall - Normal. Back - normal.  Cardiovascular Cardiovascular examination reveals -normal heart sounds, regular rate and rhythm with no murmurs and normal pedal pulses bilaterally.  Abdomen Inspection  Inspection of the abdomen reveals: Note: Examined supine and standing. Just above and to the left of the umbilicus I can feel somewhat tender bulge. This seems mostly reducible. Defect is not that large. Consistent with the CT scan. I do not feel a mass at the base of the umbilicus. While this may be a variant of umbilical hernia and also may be an incisional hernia related to her cholecystectomy no inguinal hernia noted. Skin - Scar - Note: Scars from C-section and laparoscopic cholecystectomy otherwise healed. Palpation/Percussion Palpation and Percussion of the abdomen reveal - Soft, Non Tender, No Rebound tenderness, No Rigidity (guarding) and No hepatosplenomegaly. Auscultation Auscultation of the abdomen reveals - Bowel sounds normal.  Neurologic Neurologic evaluation reveals -alert and oriented x 3 with no impairment of recent or remote memory. Mental Status-Normal.  Musculoskeletal Normal Exam - Left-Upper Extremity Strength Normal and Lower Extremity Strength Normal. Normal Exam - Right-Upper Extremity Strength Normal and Lower Extremity Strength Normal.  Lymphatic Head & Neck  General Head & Neck Lymphatics: Bilateral - Description - Normal. Axillary  General Axillary  Region: Bilateral - Description - Normal. Tenderness - Non Tender. Femoral & Inguinal  Generalized Femoral & Inguinal Lymphatics: Bilateral -  Description - Normal. Tenderness - Non Tender.    Assessment & Plan  INCISIONAL HERNIA, WITHOUT OBSTRUCTION OR GANGRENE (K43.2) .  You have a hernia just above and to the left of her umbilicus I can feel this on exam and we can see it on the CT scan Fortunately, the intestine is not entrapped in this at this time The biggest problem you're having his pain and you would like to have this repaired because of the pain That is reasonable, cystoscopy hernia will tend to get bigger and become more painful over time, as we discussed  You are adviseds Dr. Dalbert Batman will be retiring in January. It was your request that Dr. 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 Dalbert Batman, M.D., [facility] General and Minimally invasive Surgery Breast and Colorectal Surgery

## 2019-09-02 DIAGNOSIS — E7800 Pure hypercholesterolemia, unspecified: Secondary | ICD-10-CM | POA: Diagnosis not present

## 2019-09-02 DIAGNOSIS — I10 Essential (primary) hypertension: Secondary | ICD-10-CM | POA: Diagnosis not present

## 2019-09-03 ENCOUNTER — Other Ambulatory Visit (HOSPITAL_COMMUNITY)
Admission: RE | Admit: 2019-09-03 | Discharge: 2019-09-03 | Disposition: A | Discharge status: Home / Self Care | Payer: BC Managed Care – PPO | Source: Ambulatory Visit | Attending: General Surgery | Admitting: General Surgery

## 2019-09-03 DIAGNOSIS — Z20828 Contact with and (suspected) exposure to other viral communicable diseases: Secondary | ICD-10-CM | POA: Diagnosis not present

## 2019-09-03 DIAGNOSIS — Z01812 Encounter for preprocedural laboratory examination: Secondary | ICD-10-CM | POA: Insufficient documentation

## 2019-09-04 LAB — NOVEL CORONAVIRUS, NAA (HOSP ORDER, SEND-OUT TO REF LAB; TAT 18-24 HRS): SARS-CoV-2, NAA: NOT DETECTED

## 2019-09-06 MED ORDER — VANCOMYCIN HCL 10 G IV SOLR
1500.0000 mg | INTRAVENOUS | Status: AC
  Administered 2019-09-07: 1500 mg via INTRAVENOUS
  Filled 2019-09-06: qty 1500

## 2019-09-07 ENCOUNTER — Encounter (HOSPITAL_COMMUNITY): Payer: Self-pay

## 2019-09-07 ENCOUNTER — Ambulatory Visit (HOSPITAL_COMMUNITY): Payer: BC Managed Care – PPO | Admitting: Certified Registered"

## 2019-09-07 ENCOUNTER — Ambulatory Visit (HOSPITAL_COMMUNITY): Payer: BC Managed Care – PPO | Admitting: Physician Assistant

## 2019-09-07 ENCOUNTER — Encounter (HOSPITAL_COMMUNITY)
Admission: RE | Disposition: A | Discharge status: Home / Self Care | Payer: Self-pay | Source: Home / Self Care | Attending: General Surgery

## 2019-09-07 ENCOUNTER — Observation Stay (HOSPITAL_COMMUNITY)
Admission: RE | Admit: 2019-09-07 | Discharge: 2019-09-08 | Disposition: A | Discharge status: Home / Self Care | Payer: BC Managed Care – PPO | Attending: General Surgery | Admitting: General Surgery

## 2019-09-07 ENCOUNTER — Other Ambulatory Visit: Payer: Self-pay

## 2019-09-07 DIAGNOSIS — I10 Essential (primary) hypertension: Secondary | ICD-10-CM | POA: Diagnosis not present

## 2019-09-07 DIAGNOSIS — Z881 Allergy status to other antibiotic agents status: Secondary | ICD-10-CM | POA: Diagnosis not present

## 2019-09-07 DIAGNOSIS — K432 Incisional hernia without obstruction or gangrene: Principal | ICD-10-CM | POA: Diagnosis present

## 2019-09-07 DIAGNOSIS — Z79899 Other long term (current) drug therapy: Secondary | ICD-10-CM | POA: Diagnosis not present

## 2019-09-07 DIAGNOSIS — K219 Gastro-esophageal reflux disease without esophagitis: Secondary | ICD-10-CM | POA: Diagnosis not present

## 2019-09-07 HISTORY — PX: INCISIONAL HERNIA REPAIR: SHX193

## 2019-09-07 HISTORY — DX: Incisional hernia without obstruction or gangrene: K43.2

## 2019-09-07 LAB — POCT PREGNANCY, URINE: Preg Test, Ur: NEGATIVE

## 2019-09-07 SURGERY — REPAIR, HERNIA, INCISIONAL, LAPAROSCOPIC
Anesthesia: General | Site: Abdomen

## 2019-09-07 MED ORDER — GABAPENTIN 300 MG PO CAPS
ORAL_CAPSULE | ORAL | Status: AC
  Administered 2019-09-07: 300 mg via ORAL
  Filled 2019-09-07: qty 1

## 2019-09-07 MED ORDER — PROPOFOL 10 MG/ML IV BOLUS
INTRAVENOUS | Status: DC | PRN
  Administered 2019-09-07: 50 mg via INTRAVENOUS
  Administered 2019-09-07: 160 mg via INTRAVENOUS

## 2019-09-07 MED ORDER — MIDAZOLAM HCL 5 MG/5ML IJ SOLN
INTRAMUSCULAR | Status: DC | PRN
  Administered 2019-09-07: 2 mg via INTRAVENOUS

## 2019-09-07 MED ORDER — ROCURONIUM BROMIDE 10 MG/ML (PF) SYRINGE
PREFILLED_SYRINGE | INTRAVENOUS | Status: AC
  Filled 2019-09-07: qty 10

## 2019-09-07 MED ORDER — HYDROMORPHONE HCL 1 MG/ML IJ SOLN
0.2500 mg | INTRAMUSCULAR | Status: DC | PRN
  Administered 2019-09-07: 1 mg via INTRAVENOUS

## 2019-09-07 MED ORDER — ROCURONIUM BROMIDE 10 MG/ML (PF) SYRINGE
PREFILLED_SYRINGE | INTRAVENOUS | Status: DC | PRN
  Administered 2019-09-07: 50 mg via INTRAVENOUS
  Administered 2019-09-07: 20 mg via INTRAVENOUS

## 2019-09-07 MED ORDER — FENTANYL CITRATE (PF) 250 MCG/5ML IJ SOLN
INTRAMUSCULAR | Status: AC
  Filled 2019-09-07: qty 5

## 2019-09-07 MED ORDER — TRAMADOL HCL 50 MG PO TABS: 50.0000 mg | ORAL_TABLET | Freq: Four times a day (QID) | ORAL | Status: DC | PRN

## 2019-09-07 MED ORDER — SUGAMMADEX SODIUM 200 MG/2ML IV SOLN
INTRAVENOUS | Status: DC | PRN
  Administered 2019-09-07: 180 mg via INTRAVENOUS

## 2019-09-07 MED ORDER — HYDROMORPHONE HCL 1 MG/ML IJ SOLN: 1.0000 mg | INTRAMUSCULAR | Status: DC | PRN

## 2019-09-07 MED ORDER — FLUTICASONE PROPIONATE 50 MCG/ACT NA SUSP
1.0000 | Freq: Every day | NASAL | Status: DC
  Filled 2019-09-07: qty 16

## 2019-09-07 MED ORDER — GABAPENTIN 300 MG PO CAPS
300.0000 mg | ORAL_CAPSULE | ORAL | Status: AC
  Administered 2019-09-07: 11:00:00 300 mg via ORAL

## 2019-09-07 MED ORDER — LACTATED RINGERS IV SOLN
INTRAVENOUS | Status: DC
  Administered 2019-09-07 – 2019-09-08 (×2): via INTRAVENOUS

## 2019-09-07 MED ORDER — HYDROCODONE-ACETAMINOPHEN 5-325 MG PO TABS
1.0000 | ORAL_TABLET | ORAL | Status: DC | PRN
  Administered 2019-09-07 – 2019-09-08 (×4): 1 via ORAL
  Filled 2019-09-07 (×4): qty 1

## 2019-09-07 MED ORDER — CHLORHEXIDINE GLUCONATE CLOTH 2 % EX PADS: 6.0000 | MEDICATED_PAD | Freq: Once | CUTANEOUS | Status: DC

## 2019-09-07 MED ORDER — ONDANSETRON 4 MG PO TBDP: 4.0000 mg | ORAL_TABLET | Freq: Four times a day (QID) | ORAL | Status: DC | PRN

## 2019-09-07 MED ORDER — LACTATED RINGERS IV SOLN
INTRAVENOUS | Status: DC
  Administered 2019-09-07 (×3): via INTRAVENOUS

## 2019-09-07 MED ORDER — ONDANSETRON HCL 4 MG/2ML IJ SOLN
INTRAMUSCULAR | Status: AC
  Filled 2019-09-07: qty 2

## 2019-09-07 MED ORDER — LIDOCAINE 2% (20 MG/ML) 5 ML SYRINGE
INTRAMUSCULAR | Status: AC
  Filled 2019-09-07: qty 5

## 2019-09-07 MED ORDER — DEXAMETHASONE SODIUM PHOSPHATE 10 MG/ML IJ SOLN
INTRAMUSCULAR | Status: AC
  Filled 2019-09-07: qty 1

## 2019-09-07 MED ORDER — PHENYLEPHRINE 40 MCG/ML (10ML) SYRINGE FOR IV PUSH (FOR BLOOD PRESSURE SUPPORT)
PREFILLED_SYRINGE | INTRAVENOUS | Status: DC | PRN
  Administered 2019-09-07 (×2): 80 ug via INTRAVENOUS

## 2019-09-07 MED ORDER — METHOCARBAMOL 500 MG PO TABS: 500.0000 mg | ORAL_TABLET | Freq: Four times a day (QID) | ORAL | Status: DC | PRN

## 2019-09-07 MED ORDER — HYDROCHLOROTHIAZIDE 12.5 MG PO CAPS
12.5000 mg | ORAL_CAPSULE | Freq: Every day | ORAL | Status: DC
  Administered 2019-09-07: 12.5 mg via ORAL
  Filled 2019-09-07: qty 1

## 2019-09-07 MED ORDER — PANTOPRAZOLE SODIUM 40 MG PO TBEC
40.0000 mg | DELAYED_RELEASE_TABLET | Freq: Every day | ORAL | Status: DC
  Administered 2019-09-07: 40 mg via ORAL
  Filled 2019-09-07: qty 1

## 2019-09-07 MED ORDER — BUPIVACAINE LIPOSOME 1.3 % IJ SUSP
20.0000 mL | Freq: Once | INTRAMUSCULAR | Status: AC
  Administered 2019-09-07: 20 mL
  Filled 2019-09-07: qty 20

## 2019-09-07 MED ORDER — SODIUM CHLORIDE 0.9 % IR SOLN
Status: DC | PRN
  Administered 2019-09-07: 1000 mL

## 2019-09-07 MED ORDER — ACETAMINOPHEN 500 MG PO TABS
1000.0000 mg | ORAL_TABLET | ORAL | Status: AC
  Administered 2019-09-07: 11:00:00 1000 mg via ORAL

## 2019-09-07 MED ORDER — PROPOFOL 10 MG/ML IV BOLUS
INTRAVENOUS | Status: AC
  Filled 2019-09-07: qty 20

## 2019-09-07 MED ORDER — MIDAZOLAM HCL 2 MG/2ML IJ SOLN
INTRAMUSCULAR | Status: AC
  Filled 2019-09-07: qty 2

## 2019-09-07 MED ORDER — CELECOXIB 200 MG PO CAPS
200.0000 mg | ORAL_CAPSULE | ORAL | Status: AC
  Administered 2019-09-07: 11:00:00 200 mg via ORAL

## 2019-09-07 MED ORDER — SENNA 8.6 MG PO TABS
1.0000 | ORAL_TABLET | Freq: Two times a day (BID) | ORAL | Status: DC
  Administered 2019-09-07 – 2019-09-08 (×2): 8.6 mg via ORAL
  Filled 2019-09-07 (×2): qty 1

## 2019-09-07 MED ORDER — HYDROMORPHONE HCL 1 MG/ML IJ SOLN
INTRAMUSCULAR | Status: AC
  Filled 2019-09-07: qty 1

## 2019-09-07 MED ORDER — CELECOXIB 200 MG PO CAPS
ORAL_CAPSULE | ORAL | Status: AC
  Administered 2019-09-07: 200 mg via ORAL
  Filled 2019-09-07: qty 1

## 2019-09-07 MED ORDER — SUCCINYLCHOLINE CHLORIDE 20 MG/ML IJ SOLN
INTRAMUSCULAR | Status: DC | PRN
  Administered 2019-09-07: 140 mg via INTRAVENOUS

## 2019-09-07 MED ORDER — CULTURELLE PROBIOTICS PO CHEW: CHEWABLE_TABLET | Freq: Every day | ORAL | Status: DC

## 2019-09-07 MED ORDER — DEXAMETHASONE SODIUM PHOSPHATE 10 MG/ML IJ SOLN
INTRAMUSCULAR | Status: DC | PRN
  Administered 2019-09-07: 4 mg via INTRAVENOUS

## 2019-09-07 MED ORDER — ONDANSETRON HCL 4 MG/2ML IJ SOLN: 4.0000 mg | Freq: Four times a day (QID) | INTRAMUSCULAR | Status: DC | PRN

## 2019-09-07 MED ORDER — ONDANSETRON HCL 4 MG/2ML IJ SOLN
INTRAMUSCULAR | Status: DC | PRN
  Administered 2019-09-07: 4 mg via INTRAVENOUS

## 2019-09-07 MED ORDER — FENTANYL CITRATE (PF) 250 MCG/5ML IJ SOLN
INTRAMUSCULAR | Status: DC | PRN
  Administered 2019-09-07: 25 ug via INTRAVENOUS
  Administered 2019-09-07 (×5): 50 ug via INTRAVENOUS

## 2019-09-07 MED ORDER — ACETAMINOPHEN 500 MG PO TABS
ORAL_TABLET | ORAL | Status: AC
  Administered 2019-09-07: 1000 mg via ORAL
  Filled 2019-09-07: qty 2

## 2019-09-07 MED ORDER — LIDOCAINE 2% (20 MG/ML) 5 ML SYRINGE
INTRAMUSCULAR | Status: DC | PRN
  Administered 2019-09-07: 80 mg via INTRAVENOUS

## 2019-09-07 SURGICAL SUPPLY — 49 items
ADH SKN CLS APL DERMABOND .7 (GAUZE/BANDAGES/DRESSINGS) ×1
APL PRP STRL LF DISP 70% ISPRP (MISCELLANEOUS) ×1
APPLIER CLIP LOGIC TI 5 (MISCELLANEOUS) IMPLANT
APR CLP MED LRG 33X5 (MISCELLANEOUS)
BINDER ABDOMINAL 12 ML 46-62 (SOFTGOODS) ×1 IMPLANT
BLADE CLIPPER SURG (BLADE) IMPLANT
CANISTER SUCT 3000ML PPV (MISCELLANEOUS) IMPLANT
CHLORAPREP W/TINT 26 (MISCELLANEOUS) ×2 IMPLANT
COVER SURGICAL LIGHT HANDLE (MISCELLANEOUS) ×2 IMPLANT
COVER WAND RF STERILE (DRAPES) ×2 IMPLANT
DECANTER SPIKE VIAL GLASS SM (MISCELLANEOUS) ×2 IMPLANT
DERMABOND ADVANCED (GAUZE/BANDAGES/DRESSINGS) ×1
DERMABOND ADVANCED .7 DNX12 (GAUZE/BANDAGES/DRESSINGS) ×1 IMPLANT
DEVICE SECURE STRAP 25 ABSORB (INSTRUMENTS) ×3 IMPLANT
DEVICE TROCAR PUNCTURE CLOSURE (ENDOMECHANICALS) ×2 IMPLANT
DRAPE LAPAROSCOPIC ABDOMINAL (DRAPES) ×2 IMPLANT
ELECT REM PT RETURN 9FT ADLT (ELECTROSURGICAL) ×2
ELECTRODE REM PT RTRN 9FT ADLT (ELECTROSURGICAL) ×1 IMPLANT
GAUZE SPONGE 4X4 12PLY STRL (GAUZE/BANDAGES/DRESSINGS) ×1 IMPLANT
GLOVE SS BIOGEL STRL SZ 7 (GLOVE) ×1 IMPLANT
GLOVE SUPERSENSE BIOGEL SZ 7 (GLOVE) ×1
GOWN STRL REUS W/ TWL LRG LVL3 (GOWN DISPOSABLE) ×2 IMPLANT
GOWN STRL REUS W/ TWL XL LVL3 (GOWN DISPOSABLE) ×1 IMPLANT
GOWN STRL REUS W/TWL LRG LVL3 (GOWN DISPOSABLE) ×4
GOWN STRL REUS W/TWL XL LVL3 (GOWN DISPOSABLE) ×2
KIT BASIN OR (CUSTOM PROCEDURE TRAY) ×2 IMPLANT
KIT TURNOVER KIT B (KITS) ×2 IMPLANT
MARKER SKIN DUAL TIP RULER LAB (MISCELLANEOUS) ×2 IMPLANT
MESH VENTRALIGHT ST 7X9N (Mesh General) ×1 IMPLANT
NDL SPNL 22GX3.5 QUINCKE BK (NEEDLE) ×1 IMPLANT
NEEDLE SPNL 22GX3.5 QUINCKE BK (NEEDLE) ×2 IMPLANT
NS IRRIG 1000ML POUR BTL (IV SOLUTION) ×2 IMPLANT
PAD ABD 8X10 STRL (GAUZE/BANDAGES/DRESSINGS) ×1 IMPLANT
PAD ARMBOARD 7.5X6 YLW CONV (MISCELLANEOUS) ×4 IMPLANT
SCISSORS LAP 5X35 DISP (ENDOMECHANICALS) ×1 IMPLANT
SET IRRIG TUBING LAPAROSCOPIC (IRRIGATION / IRRIGATOR) ×1 IMPLANT
SET TUBE SMOKE EVAC HIGH FLOW (TUBING) ×2 IMPLANT
SHEARS HARMONIC ACE PLUS 36CM (ENDOMECHANICALS) IMPLANT
SLEEVE ENDOPATH XCEL 5M (ENDOMECHANICALS) ×8 IMPLANT
SUT MON AB 4-0 PC3 18 (SUTURE) ×2 IMPLANT
SUT NOVA NAB DX-16 0-1 5-0 T12 (SUTURE) ×2 IMPLANT
TOWEL GREEN STERILE (TOWEL DISPOSABLE) ×2 IMPLANT
TOWEL GREEN STERILE FF (TOWEL DISPOSABLE) ×2 IMPLANT
TRAY FOLEY W/BAG SLVR 16FR (SET/KITS/TRAYS/PACK)
TRAY FOLEY W/BAG SLVR 16FR ST (SET/KITS/TRAYS/PACK) IMPLANT
TRAY LAPAROSCOPIC MC (CUSTOM PROCEDURE TRAY) ×2 IMPLANT
TROCAR XCEL NON-BLD 11X100MML (ENDOMECHANICALS) ×2 IMPLANT
TROCAR XCEL NON-BLD 5MMX100MML (ENDOMECHANICALS) ×2 IMPLANT
WATER STERILE IRR 1000ML POUR (IV SOLUTION) ×2 IMPLANT

## 2019-09-07 NOTE — Interval H&P Note (Signed)
History and Physical Interval Note:  09/07/2019 10:45 AM  Julia Miranda  has presented today for surgery, with the diagnosis of INCISIONAL HERNIA.  The various methods of treatment have been discussed with the patient and family. After consideration of risks, benefits and other options for treatment, the patient has consented to  Procedure(s): [place] (N/A) as a surgical intervention.  The patient's history has been reviewed, patient examined, no change in status, stable for surgery.  I have reviewed the patient's chart and labs.  Questions were answered to the patient's satisfaction.     Adin Hector

## 2019-09-07 NOTE — Anesthesia Postprocedure Evaluation (Signed)
Anesthesia Post Note  Patient: Julia Miranda  Procedure(s) Performed: LAPAROSCOPIC INCISIONAL HERNIA REPAIR WITH MESH (N/A Abdomen)     Patient location during evaluation: PACU Anesthesia Type: General Level of consciousness: awake Pain management: pain level controlled Respiratory status: spontaneous breathing Postop Assessment: no apparent nausea or vomiting Anesthetic complications: no    Last Vitals:  Vitals:   09/07/19 1505 09/07/19 1520  BP: 120/74 121/74  Pulse: 73 73  Resp: 16 17  Temp:    SpO2: 100% 98%    Last Pain:  Vitals:   09/07/19 1520  TempSrc:   PainSc: 8                  Roshell Brigham

## 2019-09-07 NOTE — Discharge Instructions (Signed)
[facility] ______[facility] °LAPAROSCOPIC SURGERY: POST OP INSTRUCTIONS °Always review your discharge instruction sheet given to you by the facility where your surgery was performed. °IF YOU HAVE DISABILITY OR FAMILY LEAVE FORMS, YOU MUST BRING THEM TO THE OFFICE FOR PROCESSING.   °DO NOT GIVE THEM TO YOUR DOCTOR. ° °1. A prescription for pain medication may be given to you upon discharge.  Take your pain medication as prescribed, if needed.  If narcotic pain medicine is not needed, then you may take acetaminophen (Tylenol) or ibuprofen (Advil) as needed. °2. Take your usually prescribed medications unless otherwise directed. °3. If you need a refill on your pain medication, please contact your pharmacy.  They will contact our office to request authorization. Prescriptions will not be filled after 5pm or on week-ends. °4. You should follow a light diet the first few days after arrival home, such as soup and crackers, etc.  Be sure to include lots of fluids daily. °5. Most patients will experience some swelling and bruising in the area of the incisions.  Ice packs will help.  Swelling and bruising can take several days to resolve.  °6. It is common to experience some constipation if taking pain medication after surgery.  Increasing fluid intake and taking a stool softener (such as Colace) will usually help or prevent this problem from occurring.  A mild laxative (Milk of Magnesia or Miralax) should be taken according to package instructions if there are no bowel movements after 48 hours. °7. Unless discharge instructions indicate otherwise, you may remove your bandages 24-48 hours after surgery, and you may shower at that time.  You may have steri-strips (small skin tapes) in place directly over the incision.  These strips should be left on the skin for 7-10 days.  If your surgeon used skin glue on the incision, you may shower in 24 hours.  The glue will flake off over the next 2-3 weeks.  Any sutures or  staples will be removed at the office during your follow-up visit. °8. ACTIVITIES:  You may resume regular (light) daily activities beginning the next day--such as daily self-care, walking, climbing stairs--gradually increasing activities as tolerated.  You may have sexual intercourse when it is comfortable.  Refrain from any heavy lifting or straining until approved by your doctor. °a. You may drive when you are no longer taking prescription pain medication, you can comfortably wear a seatbelt, and you can safely maneuver your car and apply brakes. °b. RETURN TO WORK:  __________________________________________________________ °9. You should see your doctor in the office for a follow-up appointment approximately 2-3 weeks after your surgery.  Make sure that you call for this appointment within a day or two after you arrive home to insure a convenient appointment time. °10. OTHER INSTRUCTIONS: __________________________________________________________________________________________________________________________ __________________________________________________________________________________________________________________________ °WHEN TO CALL YOUR DOCTOR: °1. Fever over 101.0 °2. Inability to urinate °3. Continued bleeding from incision. °4. Increased pain, redness, or drainage from the incision. °5. Increasing abdominal pain ° °The clinic staff is available to answer your questions during regular business hours.  Please don’t hesitate to call and ask to speak to one of the nurses for clinical concerns.  If you have a medical emergency, go to the nearest emergency room or call 911.  A surgeon from [facility] is always on call at the hospital. °[address], [address], [place], [place]  [address] ? [address] [address], [place], [place]   [address] °[phone] ? [phone] ? FAX [phone] °Web site:   [email or web address] ° °••••••••• ° ° °Managing Your Pain After Surgery Without  Opioids ° ° ° °Thank you for participating in our program to help patients manage their pain after surgery without opioids. This is part of our effort to provide you with the best care possible, without exposing you or your family to the risk that opioids pose. ° °What pain can I expect after surgery? °You can expect to have some pain after surgery. This is normal. The pain is typically worse the day after surgery, and quickly begins to get better. °Many studies have found that many patients are able to manage their pain after surgery with Over-the-Counter (OTC) medications such as Tylenol and Motrin. If you have a condition that does not allow you to take Tylenol or Motrin, notify your surgical team. ° °How will I manage my pain? °The best strategy for controlling your pain after surgery is around the clock pain control with Tylenol (acetaminophen) and Motrin (ibuprofen or Advil). Alternating these medications with each other allows you to maximize your pain control. In addition to Tylenol and Motrin, you can use heating pads or ice packs on your incisions to help reduce your pain. ° °How will I alternate your regular strength over-the-counter pain medication? °You will take a dose of pain medication every three hours. °; Start by taking 650 mg of Tylenol (2 pills of 325 mg) °; 3 hours later take 600 mg of Motrin (3 pills of 200 mg) °; 3 hours after taking the Motrin take 650 mg of Tylenol °; 3 hours after that take 600 mg of Motrin. ° ° °- 1 - ° °See example - if your first dose of Tylenol is at 12:00 PM ° ° °12:00 PM Tylenol 650 mg (2 pills of 325 mg)  °3:00 PM Motrin 600 mg (3 pills of 200 mg)  °6:00 PM Tylenol 650 mg (2 pills of 325 mg)  °9:00 PM Motrin 600 mg (3 pills of 200 mg)  °Continue alternating every 3 hours  ° °We recommend that you follow this schedule around-the-clock for at least 3 days after surgery, or until you feel that it is no longer needed. Use the table on the last page of this handout to  keep track of the medications you are taking. °Important: °Do not take more than 3000mg of Tylenol or 3200mg of Motrin in a 24-hour period. °Do not take ibuprofen/Motrin if you have a history of bleeding stomach ulcers, severe kidney disease, &/or actively taking a blood thinner ° °What if I still have pain? °If you have pain that is not controlled with the over-the-counter pain medications (Tylenol and Motrin or Advil) you might have what we call “breakthrough” pain. You will receive a prescription for a small amount of an opioid pain medication such as Oxycodone, Tramadol, or Tylenol with Codeine. Use these opioid pills in the first 24 hours after surgery if you have breakthrough pain. Do not take more than 1 pill every 4-6 hours. ° °If you still have uncontrolled pain after using all opioid pills, don't hesitate to call our staff using the number provided. We will help make sure you are managing your pain in the best way possible, and if necessary, we can provide a prescription for additional pain medication. ° ° °Day 1   ° °Time  °Name of Medication Number of pills taken  °Amount of Acetaminophen  °Pain Level  ° °Comments  °AM PM       °AM PM       °  AM PM       °AM PM       °AM PM       °AM PM       °AM PM       °AM PM       °Total Daily amount of Acetaminophen °Do not take more than  3,000 mg per day    ° ° °Day 2   ° °Time  °Name of Medication Number of pills °taken  °Amount of Acetaminophen  °Pain Level  ° °Comments  °AM PM       °AM PM       °AM PM       °AM PM       °AM PM       °AM PM       °AM PM       °AM PM       °Total Daily amount of Acetaminophen °Do not take more than  3,000 mg per day    ° ° °Day 3   ° °Time  °Name of Medication Number of pills taken  °Amount of Acetaminophen  °Pain Level  ° °Comments  °AM PM       °AM PM       °AM PM       °AM PM       ° ° ° °AM PM       °AM PM       °AM PM       °AM PM       °Total Daily amount of Acetaminophen °Do not take more than  3,000 mg per day    ° ° °Day  4   ° °Time  °Name of Medication Number of pills taken  °Amount of Acetaminophen  °Pain Level  ° °Comments  °AM PM       °AM PM       °AM PM       °AM PM       °AM PM       °AM PM       °AM PM       °AM PM       °Total Daily amount of Acetaminophen °Do not take more than  3,000 mg per day    ° ° °Day 5   ° °Time  °Name of Medication Number °of pills taken  °Amount of Acetaminophen  °Pain Level  ° °Comments  °AM PM       °AM PM       °AM PM       °AM PM       °AM PM       °AM PM       °AM PM       °AM PM       °Total Daily amount of Acetaminophen °Do not take more than  3,000 mg per day    ° ° ° °Day 6   ° °Time  °Name of Medication Number of pills °taken  °Amount of Acetaminophen  °Pain Level  °Comments  °AM PM       °AM PM       °AM PM       °AM PM       °AM PM       °AM PM       °AM PM       °AM PM       °Total Daily amount   of Acetaminophen °Do not take more than  3,000 mg per day    ° ° °Day 7   ° °Time  °Name of Medication Number of pills taken  °Amount of Acetaminophen  °Pain Level  ° °Comments  °AM PM       °AM PM       °AM PM       °AM PM       °AM PM       °AM PM       °AM PM       °AM PM       °Total Daily amount of Acetaminophen °Do not take more than  3,000 mg per day    ° ° ° ° °For additional information about how and where to safely dispose of unused opioid °medications - https://www.morepowerful[employer].org ° °Disclaimer: This document contains information and/or instructional materials adapted from [facility] for the typical patient with your condition. It does not replace medical advice from your health care provider because your experience may differ from that of the °typical patient. Talk to your health care provider if you have any questions about this °document, your condition or your treatment plan. °Adapted from [facility] ° ° °

## 2019-09-07 NOTE — Anesthesia Procedure Notes (Signed)
Procedure Name: Intubation Date/Time: 09/07/2019 12:41 PM Performed by: Amadeo Garnet, CRNA Pre-anesthesia Checklist: Patient identified, Emergency Drugs available, Suction available, Patient being monitored and Timeout performed Patient Re-evaluated:Patient Re-evaluated prior to induction Oxygen Delivery Method: Circle system utilized Preoxygenation: Pre-oxygenation with 100% oxygen Induction Type: IV induction and Rapid sequence Laryngoscope Size: Mac and 3 Grade View: Grade I Tube type: Oral Tube size: 7.0 mm Number of attempts: 1 Airway Equipment and Method: Stylet Placement Confirmation: ETT inserted through vocal cords under direct vision,  positive ETCO2 and breath sounds checked- equal and bilateral Secured at: 22 cm Tube secured with: Tape Dental Injury: Teeth and Oropharynx as per pre-operative assessment

## 2019-09-07 NOTE — Op Note (Signed)
Patient Name:           Julia Miranda   Date of Surgery:        09/07/2019  Pre op Diagnosis:      Ventral incisional hernia  Post op Diagnosis:    Same  Procedure:                 Laparoscopic repair of ventral incisional hernia with 21 cm x 16 cm ventralex ST mesh.  Underlay technique.                                       Bilateral laparoscopic TA PP block with Exparel.  Surgeon:                     Edsel Petrin. Dalbert Batman, M.D., FACS  Assistant:                      OR staff  Operative Indications:   This is a very pleasant 48 year old female. Who is brought to the operating room for elective laparoscopic repair of incisional hernia.  [name] is her PCP.     I performed a laparoscopic cholecystectomy in 2014. She's had 2 C-sections. This year she has noticed some pain and a bulge just above and to the left of her umbilicus. She's had a couple of episodes where this was fairly severe. She had nausea one time when she was overactive but no vomiting. A CT scan has been done and shows a small supraumbilical hernia but no entrapped contents. Nonobstructing left renal stone. Otherwise CT normal. She's having a lot of pain and wants to have something done. She also wants to be recovered from surgery by Thanksgiving when her children, home from college.      Past history significant for C-section 2, laparoscopic cholecystectomy. GERD. Recently started on medication for hypertension. BMI 35. Family history noncontributory      She'll be scheduled for laparoscopic repair of periumbilical incisional hernia with inlay mesh. She agrees with this plan.  Operative Findings:       There was a single loop of small bowel adherent to the undersurface of the umbilicus.  These adhesions came down relatively easily and there was no evidence of serosal injury to the small bowel.  She had a couple of small defects in the midline above the umbilicus.  I had to take down the falciform ligament to get  a good platforms for securing the mesh.  I had arterial bleeding from the right abdominal wall when I passed the suture passer.  This was presumably the inferior epigastric artery.  After tying off the mesh and holding some pressure this was completely hemostatic.  I probably lost 50 cc of blood.  At the completion of the case there was no hematoma and no active bleeding whatsoever.  Ventralex ST composite mesh was used as an Metallurgist.  There was no evidence of any other visceral disease or injury.  Four-quadrant inspection was performed at the beginning and at the end of the case.  Procedure in Detail:          Following the induction of general endotracheal anesthesia the patient's entire abdomen was prepped and draped in a sterile fashion.  Intravenous antibiotics were given.  Surgical timeout was performed.  Exparel was used as a local infiltration anesthetic and all the trocar  sites, and also performed laparoscopic guided TA PP block on both sides       A Veress needle was inserted in the left subcostal region, tested with saline, and the abdomen insufflated to 15 mmHg.  The Veress needle was removed and a 5 mm optical trocar was inserted without difficulty.  Four-quadrant inspection revealed no injury or bleeding.  11 mm trocar was placed in the left flank and a 5 mm trocar in the left lower quadrant.  Later in the case I placed two 5 mm trochars on the right side to gain visualization.  Visualization was difficult from only 1 side due to her high BMI and the large size of the mesh.       I encountered the single loop of small bowel which was chronically adherent to the undersurface of the umbilicus.  I was able to find a nice dissection plane in soft adhesions and take this down.  I inspected the small bowel a couple of times.  I placed Glassman clamps above and below the area of dissection there was no evidence of bulge or serosal injury.       I took the falciform ligament down with the  harmonic scalpel.  I used a spinal needle and measured the area from below the umbilicus to the most uppermost hernia defect.  All of these defects were small no more than 1 cm.  I measured the total span of the defects and then planned a mesh placement that would give me 7 cm of overlap in all directions.  I brought a ventral Lex ST mesh to the field and cut it 16 cm wide by 21.5 cm vertically.  Using a marking pen I marked a template on the abdominal wall for for suture fixation sites.  I placed #1 Novafil sutures in the edge of the mesh at the 4 suture fixation sites.  Great care was taken to orient the rough side of the mesh up toward the abdominal wall and the smooth adhesion barrier down toward the viscera.  The mesh was moistened rolled up and then inserted through the 11 mm trocar.  The mesh was then opened up and positioned according to the drawn template.  A small puncture wound was made at each of the 4 suture fixation sites.  Using a suture passer I passed the sutures up through the abdominal wall being sure to take a 1 cm bite of tissue.  As mentioned above we had transient arterial bleeding from the right lateral abdominal wall presumably through at the level of the rectus muscle.  This was transient but resulted in 50 to 65 cc of blood loss.  After all 4 sutures were passed through the abdominal wall they were lifted up and seem to deployed nicely.  All 4 Novafil sutures were tied.  The mesh was then further secured to the abdominal wall with the secure strap device in a double crown technique.  I fired the secure strap device 65 times.  I inspected this both from the right and the left on 2 or 3 occasions and it appeared very secure without any gaps.     Under direct vision I injected a dilute solution of Exparel(66%) into the preperitoneal space and the anterior axillary line.  This was done bilaterally to enhance postop pain control as a block.  Four-quadrant inspection was performed.  There was  no bleeding or bowel injury.  Pneumoperitoneum was released and the trochars were removed.  I injected  Exparel into all the trocar sites.  The skin incisions were closed with subcuticular 4-0 Monocryl and Dermabond.  Abdominal binder was placed and the patient taken to PACU in stable condition.  EBL 50 to 75 cc.  Counts correct.  Complications none.  Plan observation overnight     Angelia Mould. Derrell Lolling, M.D., FACS General and Minimally Invasive Surgery Breast and Colorectal Surgery  09/07/2019 2:21 PM

## 2019-09-07 NOTE — Transfer of Care (Signed)
Immediate Anesthesia Transfer of Care Note  Patient: Julia Miranda  Procedure(s) Performed: LAPAROSCOPIC INCISIONAL HERNIA REPAIR WITH MESH (N/A Abdomen)  Patient Location: PACU  Anesthesia Type:General  Level of Consciousness: awake, alert  and oriented  Airway & Oxygen Therapy: Patient Spontanous Breathing and Patient connected to face mask oxygen  Post-op Assessment: Report given to RN, Post -op Vital signs reviewed and stable and Patient moving all extremities  Post vital signs: Reviewed and stable  Last Vitals:  Vitals Value Taken Time  BP 109/55 09/07/19 1435  Temp    Pulse 75 09/07/19 1437  Resp 18 09/07/19 1437  SpO2 100 % 09/07/19 1437  Vitals shown include unvalidated device data.  Last Pain:  Vitals:   09/07/19 1056  TempSrc:   PainSc: 0-No pain         Complications: No apparent anesthesia complications

## 2019-09-07 NOTE — Anesthesia Preprocedure Evaluation (Signed)
Anesthesia Evaluation  Patient identified by MRN, date of birth, ID band Patient awake    Reviewed: Allergy & Precautions, NPO status , Patient's Chart, lab work & pertinent test results  Airway Mallampati: II  TM Distance: >3 FB     Dental   Pulmonary neg pulmonary ROS,    breath sounds clear to auscultation       Cardiovascular hypertension,  Rhythm:Regular Rate:Normal     Neuro/Psych negative neurological ROS     GI/Hepatic Neg liver ROS, GERD  ,  Endo/Other  negative endocrine ROS  Renal/GU negative Renal ROS     Musculoskeletal   Abdominal   Peds  Hematology negative hematology ROS (+)   Anesthesia Other Findings   Reproductive/Obstetrics                             Anesthesia Physical Anesthesia Plan  ASA: III  Anesthesia Plan: General   Post-op Pain Management:    Induction: Intravenous  PONV Risk Score and Plan: 3 and Ondansetron, Dexamethasone and Midazolam  Airway Management Planned: Oral ETT  Additional Equipment:   Intra-op Plan:   Post-operative Plan: Extubation in OR  Informed Consent: I have reviewed the patients History and Physical, chart, labs and discussed the procedure including the risks, benefits and alternatives for the proposed anesthesia with the patient or authorized representative who has indicated his/her understanding and acceptance.     Dental advisory given  Plan Discussed with: CRNA, Anesthesiologist and Surgeon  Anesthesia Plan Comments:         Anesthesia Quick Evaluation

## 2019-09-08 ENCOUNTER — Encounter (HOSPITAL_COMMUNITY): Payer: Self-pay | Admitting: General Surgery

## 2019-09-08 DIAGNOSIS — K219 Gastro-esophageal reflux disease without esophagitis: Secondary | ICD-10-CM | POA: Diagnosis not present

## 2019-09-08 DIAGNOSIS — Z881 Allergy status to other antibiotic agents status: Secondary | ICD-10-CM | POA: Diagnosis not present

## 2019-09-08 DIAGNOSIS — I10 Essential (primary) hypertension: Secondary | ICD-10-CM | POA: Diagnosis not present

## 2019-09-08 DIAGNOSIS — Z79899 Other long term (current) drug therapy: Secondary | ICD-10-CM | POA: Diagnosis not present

## 2019-09-08 DIAGNOSIS — K432 Incisional hernia without obstruction or gangrene: Secondary | ICD-10-CM | POA: Diagnosis not present

## 2019-09-08 LAB — CBC
HCT: 35.1 % — ABNORMAL LOW (ref 36.0–46.0)
Hemoglobin: 11.8 g/dL — ABNORMAL LOW (ref 12.0–15.0)
MCH: 29.2 pg (ref 26.0–34.0)
MCHC: 33.6 g/dL (ref 30.0–36.0)
MCV: 86.9 fL (ref 80.0–100.0)
Platelets: 285 10*3/uL (ref 150–400)
RBC: 4.04 MIL/uL (ref 3.87–5.11)
RDW: 14.2 % (ref 11.5–15.5)
WBC: 9 10*3/uL (ref 4.0–10.5)
nRBC: 0 % (ref 0.0–0.2)

## 2019-09-08 MED ORDER — HYDROCODONE-ACETAMINOPHEN 5-325 MG PO TABS: 1.0000 | ORAL_TABLET | ORAL | 0 refills | Status: AC | PRN

## 2019-09-08 NOTE — Discharge Summary (Signed)
Patient ID: Julia Miranda [id number] 48 y.o. 1971-05-12  Admit date: 09/07/2019  Discharge date and time: 09/07/2019  Admitting Physician: Julia Miranda  Discharge Physician: Julia Miranda  Admission Diagnoses: Julia Miranda  Discharge Diagnoses: Incisional hernia                                         History laparoscopic cholecystectomy                                         History of C-section                                         BMI 35                                         Hypertension, essential, benign  Operations: Procedure(s): LAPAROSCOPIC INCISIONAL HERNIA REPAIR WITH MESH  Admission Condition: good  Discharged Condition: good  Indication for Admission: This is a very pleasant 48 year old female. Who is brought to the operating room for elective laparoscopic repair of incisional hernia.  Julia Miranda is her PCP. I performed a laparoscopic cholecystectomy in 2014. She's had 2 C-sections. This year she has noticed some pain and a bulge just above and to the left of her umbilicus. She's had a couple of episodes where this was fairly severe. She had nausea one time when she was overactive but no vomiting. A CT scan has been done and shows a small supraumbilical hernia but no entrapped contents. Nonobstructing left renal stone. Otherwise CT normal. She's having a lot of pain and wants to have something done. She also wants to be recovered from surgery by Thanksgiving when her children, home from college. Past history significant for C-section 2, laparoscopic cholecystectomy. GERD. Recently started on medication for hypertension. BMI 35. Family history noncontributory She was admitted overnight for observation for complications  Hospital Course: On the day of admission the patient was taken to the operating room and underwent laparoscopic lysis of adhesions and laparoscopic repair of her incisional hernia with mesh.  I found that  she had a loop of small bowel adherent to the undersurface of the umbilicus.  Takedown of adhesion was uneventful.  There was 2 small defects in the midline above this.  I took down the falciform ligament and placed a large sheet of ventral Lex ST composite mesh as an underlay repair with suture fixation and secure strap fixation.     She had a little bit of bleeding from the left rectus sheath.  I chose to observe her overnight     On postop day 1 she was doing extremely well.  She was in good spirits.  Had minimal pain.  Had been ambulating independently.  Voiding without difficulty.  Was very hungry and enjoying her food.  Abdominal exam revealed that all the wounds look good.  Abdomen was soft.  Nondistended.  No hematomas.     Preop hemoglobin was 14.2.  Post postop day 1 hemoglobin was 11.8.  Not felt this was consistent with a small blood loss and full hydration  overnight.  There was no evidence of active bleeding postop     She was given instruction in diet and activities.  She was given instruction in nonnarcotic pain medication.  I also called a prescription in for Norco to her pharmacy in case she needs it.  She has an appointment to see me on November 20.     Addendum: I logged onto the PMP aware website and reviewed her prescription medication history  Consults: None  Significant Diagnostic Studies: Basic lab work  Treatments: surgery: Laparoscopic repair of ventral incisional hernia with mesh  Disposition: Home  Patient Instructions:  Allergies as of 09/08/2019      Reactions   Cephalosporins Hives      Medication List    STOP taking these medications   HYDROcodone-ibuprofen 7.5-200 MG tablet Commonly known as: Vicoprofen     TAKE these medications   CULTURELLE PROBIOTICS PO Take 1 capsule by mouth daily.   fexofenadine 180 MG tablet Commonly known as: ALLEGRA Take 180 mg by mouth daily.   fluticasone 50 MCG/ACT nasal spray Commonly known as: FLONASE Place 1  spray into both nostrils daily.   hydrochlorothiazide 12.5 MG capsule Commonly known as: MICROZIDE Take 12.5 mg by mouth daily.   HYDROcodone-acetaminophen 5-325 MG tablet Commonly known as: NORCO/VICODIN Take 1-2 tablets by mouth every 4 (four) hours as needed for moderate pain.   pantoprazole 40 MG tablet Commonly known as: PROTONIX Take 1 tablet (40 mg total) by mouth daily.       Activity: Frequent ambulation encouraged.  No sports or heavy lifting for 5 weeks Diet: low fat, low cholesterol diet Wound Care: as directed  Follow-up:  With Dr. Derrell Miranda in 3 weeks.  To keep scheduled appointment November 20  Signed: Reilynn Lauro M. Derrell Miranda, M.D., FACS General and minimally invasive surgery Breast and Colorectal Surgery  09/08/2019, 7:51 AM

## 2019-09-08 NOTE — Plan of Care (Signed)
  Problem: Clinical Measurements: Goal: Postoperative complications will be avoided or minimized Outcome: Progressing   

## 2019-09-12 DIAGNOSIS — E7800 Pure hypercholesterolemia, unspecified: Secondary | ICD-10-CM | POA: Diagnosis not present

## 2019-09-12 DIAGNOSIS — Z09 Encounter for follow-up examination after completed treatment for conditions other than malignant neoplasm: Secondary | ICD-10-CM | POA: Diagnosis not present

## 2019-09-12 DIAGNOSIS — Z0000 Encounter for general adult medical examination without abnormal findings: Secondary | ICD-10-CM | POA: Diagnosis not present

## 2019-09-12 DIAGNOSIS — I10 Essential (primary) hypertension: Secondary | ICD-10-CM | POA: Diagnosis not present

## 2019-09-12 DIAGNOSIS — K219 Gastro-esophageal reflux disease without esophagitis: Secondary | ICD-10-CM | POA: Diagnosis not present

## 2019-09-27 DIAGNOSIS — H04123 Dry eye syndrome of bilateral lacrimal glands: Secondary | ICD-10-CM | POA: Diagnosis not present

## 2019-10-14 ENCOUNTER — Ambulatory Visit: Payer: BC Managed Care – PPO

## 2019-10-18 ENCOUNTER — Ambulatory Visit
Admission: RE | Admit: 2019-10-18 | Discharge: 2019-10-18 | Disposition: A | Discharge status: Home / Self Care | Payer: BC Managed Care – PPO | Source: Ambulatory Visit | Attending: Obstetrics and Gynecology | Admitting: Obstetrics and Gynecology

## 2019-10-18 ENCOUNTER — Other Ambulatory Visit: Payer: Self-pay

## 2019-10-18 DIAGNOSIS — Z1231 Encounter for screening mammogram for malignant neoplasm of breast: Secondary | ICD-10-CM | POA: Diagnosis not present

## 2019-10-21 DIAGNOSIS — D485 Neoplasm of uncertain behavior of skin: Secondary | ICD-10-CM | POA: Diagnosis not present

## 2019-10-21 DIAGNOSIS — B079 Viral wart, unspecified: Secondary | ICD-10-CM | POA: Diagnosis not present

## 2020-01-13 ENCOUNTER — Other Ambulatory Visit: Payer: Self-pay | Admitting: Surgery

## 2020-01-13 DIAGNOSIS — R109 Unspecified abdominal pain: Secondary | ICD-10-CM | POA: Diagnosis not present

## 2020-01-13 DIAGNOSIS — Z9889 Other specified postprocedural states: Secondary | ICD-10-CM

## 2020-01-13 DIAGNOSIS — Z8719 Personal history of other diseases of the digestive system: Secondary | ICD-10-CM

## 2020-01-24 ENCOUNTER — Ambulatory Visit
Admission: RE | Admit: 2020-01-24 | Discharge: 2020-01-24 | Disposition: A | Discharge status: Home / Self Care | Payer: BC Managed Care – PPO | Source: Ambulatory Visit | Attending: Surgery | Admitting: Surgery

## 2020-01-24 DIAGNOSIS — Z8719 Personal history of other diseases of the digestive system: Secondary | ICD-10-CM

## 2020-01-24 DIAGNOSIS — N200 Calculus of kidney: Secondary | ICD-10-CM | POA: Diagnosis not present

## 2020-01-24 MED ORDER — IOPAMIDOL (ISOVUE-300) INJECTION 61%
100.0000 mL | Freq: Once | INTRAVENOUS | Status: AC | PRN
  Administered 2020-01-24: 100 mL via INTRAVENOUS

## 2020-02-27 DIAGNOSIS — R109 Unspecified abdominal pain: Secondary | ICD-10-CM | POA: Diagnosis not present

## 2020-03-27 DIAGNOSIS — D2372 Other benign neoplasm of skin of left lower limb, including hip: Secondary | ICD-10-CM | POA: Diagnosis not present

## 2020-03-27 DIAGNOSIS — L578 Other skin changes due to chronic exposure to nonionizing radiation: Secondary | ICD-10-CM | POA: Diagnosis not present

## 2020-03-27 DIAGNOSIS — D225 Melanocytic nevi of trunk: Secondary | ICD-10-CM | POA: Diagnosis not present

## 2020-03-27 DIAGNOSIS — L821 Other seborrheic keratosis: Secondary | ICD-10-CM | POA: Diagnosis not present

## 2020-04-11 DIAGNOSIS — E7800 Pure hypercholesterolemia, unspecified: Secondary | ICD-10-CM | POA: Diagnosis not present

## 2020-04-11 DIAGNOSIS — I10 Essential (primary) hypertension: Secondary | ICD-10-CM | POA: Diagnosis not present

## 2020-04-11 DIAGNOSIS — R7309 Other abnormal glucose: Secondary | ICD-10-CM | POA: Diagnosis not present

## 2020-04-11 DIAGNOSIS — R635 Abnormal weight gain: Secondary | ICD-10-CM | POA: Diagnosis not present

## 2020-04-17 DIAGNOSIS — E7800 Pure hypercholesterolemia, unspecified: Secondary | ICD-10-CM | POA: Diagnosis not present

## 2020-04-17 DIAGNOSIS — R7303 Prediabetes: Secondary | ICD-10-CM | POA: Diagnosis not present

## 2020-04-17 DIAGNOSIS — I10 Essential (primary) hypertension: Secondary | ICD-10-CM | POA: Diagnosis not present

## 2020-04-17 DIAGNOSIS — E669 Obesity, unspecified: Secondary | ICD-10-CM | POA: Diagnosis not present

## 2020-09-07 DIAGNOSIS — Z13220 Encounter for screening for lipoid disorders: Secondary | ICD-10-CM | POA: Diagnosis not present

## 2020-09-07 DIAGNOSIS — R7303 Prediabetes: Secondary | ICD-10-CM | POA: Diagnosis not present

## 2020-09-07 DIAGNOSIS — Z0000 Encounter for general adult medical examination without abnormal findings: Secondary | ICD-10-CM | POA: Diagnosis not present

## 2020-09-12 DIAGNOSIS — E669 Obesity, unspecified: Secondary | ICD-10-CM | POA: Diagnosis not present

## 2020-09-12 DIAGNOSIS — R7303 Prediabetes: Secondary | ICD-10-CM | POA: Diagnosis not present

## 2020-09-12 DIAGNOSIS — I10 Essential (primary) hypertension: Secondary | ICD-10-CM | POA: Diagnosis not present

## 2020-09-12 DIAGNOSIS — Z23 Encounter for immunization: Secondary | ICD-10-CM | POA: Diagnosis not present

## 2020-09-12 DIAGNOSIS — Z0000 Encounter for general adult medical examination without abnormal findings: Secondary | ICD-10-CM | POA: Diagnosis not present

## 2020-09-25 ENCOUNTER — Other Ambulatory Visit: Payer: Self-pay | Admitting: Obstetrics and Gynecology

## 2020-09-25 DIAGNOSIS — Z1231 Encounter for screening mammogram for malignant neoplasm of breast: Secondary | ICD-10-CM

## 2020-11-19 ENCOUNTER — Ambulatory Visit: Payer: BC Managed Care – PPO

## 2020-11-21 DIAGNOSIS — J029 Acute pharyngitis, unspecified: Secondary | ICD-10-CM | POA: Diagnosis not present

## 2020-11-23 ENCOUNTER — Ambulatory Visit
Admission: RE | Admit: 2020-11-23 | Discharge: 2020-11-23 | Disposition: A | Discharge status: Home / Self Care | Payer: BC Managed Care – PPO | Source: Ambulatory Visit | Attending: Obstetrics and Gynecology | Admitting: Obstetrics and Gynecology

## 2020-11-23 ENCOUNTER — Other Ambulatory Visit: Payer: Self-pay | Admitting: Family Medicine

## 2020-11-23 ENCOUNTER — Other Ambulatory Visit: Payer: Self-pay

## 2020-11-23 DIAGNOSIS — Z1231 Encounter for screening mammogram for malignant neoplasm of breast: Secondary | ICD-10-CM

## 2020-11-24 DIAGNOSIS — J0100 Acute maxillary sinusitis, unspecified: Secondary | ICD-10-CM | POA: Diagnosis not present

## 2020-11-24 DIAGNOSIS — B001 Herpesviral vesicular dermatitis: Secondary | ICD-10-CM | POA: Diagnosis not present

## 2021-03-05 DIAGNOSIS — R7303 Prediabetes: Secondary | ICD-10-CM | POA: Diagnosis not present

## 2021-03-05 DIAGNOSIS — I10 Essential (primary) hypertension: Secondary | ICD-10-CM | POA: Diagnosis not present

## 2021-03-05 DIAGNOSIS — E7800 Pure hypercholesterolemia, unspecified: Secondary | ICD-10-CM | POA: Diagnosis not present

## 2021-03-05 DIAGNOSIS — D509 Iron deficiency anemia, unspecified: Secondary | ICD-10-CM | POA: Diagnosis not present

## 2021-03-18 DIAGNOSIS — K219 Gastro-esophageal reflux disease without esophagitis: Secondary | ICD-10-CM | POA: Diagnosis not present

## 2021-03-18 DIAGNOSIS — I10 Essential (primary) hypertension: Secondary | ICD-10-CM | POA: Diagnosis not present

## 2021-03-18 DIAGNOSIS — E669 Obesity, unspecified: Secondary | ICD-10-CM | POA: Diagnosis not present

## 2021-03-18 DIAGNOSIS — E7800 Pure hypercholesterolemia, unspecified: Secondary | ICD-10-CM | POA: Diagnosis not present

## 2021-03-27 DIAGNOSIS — L820 Inflamed seborrheic keratosis: Secondary | ICD-10-CM | POA: Diagnosis not present

## 2021-03-27 DIAGNOSIS — D2262 Melanocytic nevi of left upper limb, including shoulder: Secondary | ICD-10-CM | POA: Diagnosis not present

## 2021-03-27 DIAGNOSIS — L578 Other skin changes due to chronic exposure to nonionizing radiation: Secondary | ICD-10-CM | POA: Diagnosis not present

## 2021-03-27 DIAGNOSIS — D225 Melanocytic nevi of trunk: Secondary | ICD-10-CM | POA: Diagnosis not present

## 2021-03-27 DIAGNOSIS — D2261 Melanocytic nevi of right upper limb, including shoulder: Secondary | ICD-10-CM | POA: Diagnosis not present

## 2021-06-25 DIAGNOSIS — S63642A Sprain of metacarpophalangeal joint of left thumb, initial encounter: Secondary | ICD-10-CM | POA: Diagnosis not present

## 2021-09-04 DIAGNOSIS — R7303 Prediabetes: Secondary | ICD-10-CM | POA: Diagnosis not present

## 2021-09-04 DIAGNOSIS — E7800 Pure hypercholesterolemia, unspecified: Secondary | ICD-10-CM | POA: Diagnosis not present

## 2021-09-04 DIAGNOSIS — I10 Essential (primary) hypertension: Secondary | ICD-10-CM | POA: Diagnosis not present

## 2021-09-18 DIAGNOSIS — E669 Obesity, unspecified: Secondary | ICD-10-CM | POA: Diagnosis not present

## 2021-09-18 DIAGNOSIS — I10 Essential (primary) hypertension: Secondary | ICD-10-CM | POA: Diagnosis not present

## 2021-09-18 DIAGNOSIS — R7303 Prediabetes: Secondary | ICD-10-CM | POA: Diagnosis not present

## 2021-09-18 DIAGNOSIS — E7800 Pure hypercholesterolemia, unspecified: Secondary | ICD-10-CM | POA: Diagnosis not present

## 2021-09-18 DIAGNOSIS — Z0000 Encounter for general adult medical examination without abnormal findings: Secondary | ICD-10-CM | POA: Diagnosis not present

## 2021-09-25 ENCOUNTER — Other Ambulatory Visit: Payer: Self-pay | Admitting: Family Medicine

## 2021-09-25 DIAGNOSIS — Z1231 Encounter for screening mammogram for malignant neoplasm of breast: Secondary | ICD-10-CM

## 2021-09-27 DIAGNOSIS — H6121 Impacted cerumen, right ear: Secondary | ICD-10-CM | POA: Diagnosis not present

## 2021-09-27 DIAGNOSIS — J209 Acute bronchitis, unspecified: Secondary | ICD-10-CM | POA: Diagnosis not present

## 2021-09-27 DIAGNOSIS — J0100 Acute maxillary sinusitis, unspecified: Secondary | ICD-10-CM | POA: Diagnosis not present

## 2021-11-13 DIAGNOSIS — E7800 Pure hypercholesterolemia, unspecified: Secondary | ICD-10-CM | POA: Diagnosis not present

## 2021-11-26 ENCOUNTER — Ambulatory Visit
Admission: RE | Admit: 2021-11-26 | Discharge: 2021-11-26 | Disposition: A | Discharge status: Home / Self Care | Payer: BC Managed Care – PPO | Source: Ambulatory Visit | Attending: Family Medicine | Admitting: Family Medicine

## 2021-11-26 DIAGNOSIS — Z1231 Encounter for screening mammogram for malignant neoplasm of breast: Secondary | ICD-10-CM | POA: Diagnosis not present

## 2021-12-17 DIAGNOSIS — Z124 Encounter for screening for malignant neoplasm of cervix: Secondary | ICD-10-CM | POA: Diagnosis not present

## 2021-12-17 DIAGNOSIS — N911 Secondary amenorrhea: Secondary | ICD-10-CM | POA: Diagnosis not present

## 2021-12-17 DIAGNOSIS — Z01419 Encounter for gynecological examination (general) (routine) without abnormal findings: Secondary | ICD-10-CM | POA: Diagnosis not present

## 2021-12-17 DIAGNOSIS — Z01411 Encounter for gynecological examination (general) (routine) with abnormal findings: Secondary | ICD-10-CM | POA: Diagnosis not present

## 2021-12-17 DIAGNOSIS — Z113 Encounter for screening for infections with a predominantly sexual mode of transmission: Secondary | ICD-10-CM | POA: Diagnosis not present

## 2021-12-17 DIAGNOSIS — Z6831 Body mass index (BMI) 31.0-31.9, adult: Secondary | ICD-10-CM | POA: Diagnosis not present

## 2021-12-30 DIAGNOSIS — R519 Headache, unspecified: Secondary | ICD-10-CM | POA: Diagnosis not present

## 2021-12-30 DIAGNOSIS — R509 Fever, unspecified: Secondary | ICD-10-CM | POA: Diagnosis not present

## 2022-01-15 DIAGNOSIS — Z1211 Encounter for screening for malignant neoplasm of colon: Secondary | ICD-10-CM | POA: Diagnosis not present

## 2022-01-15 DIAGNOSIS — K5730 Diverticulosis of large intestine without perforation or abscess without bleeding: Secondary | ICD-10-CM | POA: Diagnosis not present

## 2022-01-15 DIAGNOSIS — D125 Benign neoplasm of sigmoid colon: Secondary | ICD-10-CM | POA: Diagnosis not present

## 2022-01-15 DIAGNOSIS — D123 Benign neoplasm of transverse colon: Secondary | ICD-10-CM | POA: Diagnosis not present

## 2022-03-07 DIAGNOSIS — E7800 Pure hypercholesterolemia, unspecified: Secondary | ICD-10-CM | POA: Diagnosis not present

## 2022-03-07 DIAGNOSIS — R7303 Prediabetes: Secondary | ICD-10-CM | POA: Diagnosis not present

## 2022-03-07 DIAGNOSIS — I10 Essential (primary) hypertension: Secondary | ICD-10-CM | POA: Diagnosis not present

## 2022-03-18 DIAGNOSIS — E7800 Pure hypercholesterolemia, unspecified: Secondary | ICD-10-CM | POA: Diagnosis not present

## 2022-03-18 DIAGNOSIS — K219 Gastro-esophageal reflux disease without esophagitis: Secondary | ICD-10-CM | POA: Diagnosis not present

## 2022-03-18 DIAGNOSIS — N951 Menopausal and female climacteric states: Secondary | ICD-10-CM | POA: Diagnosis not present

## 2022-03-18 DIAGNOSIS — I10 Essential (primary) hypertension: Secondary | ICD-10-CM | POA: Diagnosis not present

## 2022-03-28 DIAGNOSIS — H6992 Unspecified Eustachian tube disorder, left ear: Secondary | ICD-10-CM | POA: Diagnosis not present

## 2022-04-29 DIAGNOSIS — L578 Other skin changes due to chronic exposure to nonionizing radiation: Secondary | ICD-10-CM | POA: Diagnosis not present

## 2022-04-29 DIAGNOSIS — D225 Melanocytic nevi of trunk: Secondary | ICD-10-CM | POA: Diagnosis not present

## 2022-04-29 DIAGNOSIS — L820 Inflamed seborrheic keratosis: Secondary | ICD-10-CM | POA: Diagnosis not present

## 2022-04-29 DIAGNOSIS — L821 Other seborrheic keratosis: Secondary | ICD-10-CM | POA: Diagnosis not present

## 2022-04-29 DIAGNOSIS — H0260 Xanthelasma of unspecified eye, unspecified eyelid: Secondary | ICD-10-CM | POA: Diagnosis not present

## 2022-09-29 DIAGNOSIS — E7800 Pure hypercholesterolemia, unspecified: Secondary | ICD-10-CM | POA: Diagnosis not present

## 2022-09-29 DIAGNOSIS — Z0000 Encounter for general adult medical examination without abnormal findings: Secondary | ICD-10-CM | POA: Diagnosis not present

## 2022-09-29 DIAGNOSIS — R7303 Prediabetes: Secondary | ICD-10-CM | POA: Diagnosis not present

## 2022-10-07 DIAGNOSIS — Z0000 Encounter for general adult medical examination without abnormal findings: Secondary | ICD-10-CM | POA: Diagnosis not present

## 2022-10-07 DIAGNOSIS — I10 Essential (primary) hypertension: Secondary | ICD-10-CM | POA: Diagnosis not present

## 2022-10-07 DIAGNOSIS — Z23 Encounter for immunization: Secondary | ICD-10-CM | POA: Diagnosis not present

## 2022-10-07 DIAGNOSIS — E7800 Pure hypercholesterolemia, unspecified: Secondary | ICD-10-CM | POA: Diagnosis not present

## 2022-10-07 DIAGNOSIS — E669 Obesity, unspecified: Secondary | ICD-10-CM | POA: Diagnosis not present

## 2022-10-07 DIAGNOSIS — K219 Gastro-esophageal reflux disease without esophagitis: Secondary | ICD-10-CM | POA: Diagnosis not present

## 2022-11-01 DIAGNOSIS — H6692 Otitis media, unspecified, left ear: Secondary | ICD-10-CM | POA: Diagnosis not present

## 2022-11-01 DIAGNOSIS — H6992 Unspecified Eustachian tube disorder, left ear: Secondary | ICD-10-CM | POA: Diagnosis not present

## 2022-11-13 DIAGNOSIS — R0981 Nasal congestion: Secondary | ICD-10-CM | POA: Diagnosis not present

## 2022-11-13 DIAGNOSIS — J324 Chronic pansinusitis: Secondary | ICD-10-CM | POA: Diagnosis not present

## 2022-11-13 DIAGNOSIS — R051 Acute cough: Secondary | ICD-10-CM | POA: Diagnosis not present

## 2022-11-13 DIAGNOSIS — H6692 Otitis media, unspecified, left ear: Secondary | ICD-10-CM | POA: Diagnosis not present

## 2022-12-30 ENCOUNTER — Other Ambulatory Visit: Payer: Self-pay | Admitting: Family Medicine

## 2022-12-30 DIAGNOSIS — Z1231 Encounter for screening mammogram for malignant neoplasm of breast: Secondary | ICD-10-CM

## 2022-12-31 ENCOUNTER — Ambulatory Visit
Admission: RE | Admit: 2022-12-31 | Discharge: 2022-12-31 | Disposition: A | Discharge status: Home / Self Care | Payer: BC Managed Care – PPO | Source: Ambulatory Visit | Attending: Family Medicine | Admitting: Family Medicine

## 2022-12-31 DIAGNOSIS — Z1231 Encounter for screening mammogram for malignant neoplasm of breast: Secondary | ICD-10-CM

## 2023-03-11 DIAGNOSIS — I10 Essential (primary) hypertension: Secondary | ICD-10-CM | POA: Diagnosis not present

## 2023-03-11 DIAGNOSIS — D509 Iron deficiency anemia, unspecified: Secondary | ICD-10-CM | POA: Diagnosis not present

## 2023-03-11 DIAGNOSIS — R7309 Other abnormal glucose: Secondary | ICD-10-CM | POA: Diagnosis not present

## 2023-03-16 DIAGNOSIS — E7800 Pure hypercholesterolemia, unspecified: Secondary | ICD-10-CM | POA: Diagnosis not present

## 2023-03-16 DIAGNOSIS — I10 Essential (primary) hypertension: Secondary | ICD-10-CM | POA: Diagnosis not present

## 2023-03-16 DIAGNOSIS — R7309 Other abnormal glucose: Secondary | ICD-10-CM | POA: Diagnosis not present

## 2023-03-16 DIAGNOSIS — E669 Obesity, unspecified: Secondary | ICD-10-CM | POA: Diagnosis not present

## 2023-05-06 DIAGNOSIS — L820 Inflamed seborrheic keratosis: Secondary | ICD-10-CM | POA: Diagnosis not present

## 2023-05-06 DIAGNOSIS — L720 Epidermal cyst: Secondary | ICD-10-CM | POA: Diagnosis not present

## 2023-05-06 DIAGNOSIS — L089 Local infection of the skin and subcutaneous tissue, unspecified: Secondary | ICD-10-CM | POA: Diagnosis not present

## 2023-05-06 DIAGNOSIS — D225 Melanocytic nevi of trunk: Secondary | ICD-10-CM | POA: Diagnosis not present

## 2023-05-06 DIAGNOSIS — L578 Other skin changes due to chronic exposure to nonionizing radiation: Secondary | ICD-10-CM | POA: Diagnosis not present

## 2023-06-15 DIAGNOSIS — H04209 Unspecified epiphora, unspecified lacrimal gland: Secondary | ICD-10-CM | POA: Diagnosis not present

## 2023-07-02 DIAGNOSIS — H6692 Otitis media, unspecified, left ear: Secondary | ICD-10-CM | POA: Diagnosis not present

## 2023-07-02 DIAGNOSIS — R0981 Nasal congestion: Secondary | ICD-10-CM | POA: Diagnosis not present

## 2023-07-02 DIAGNOSIS — R070 Pain in throat: Secondary | ICD-10-CM | POA: Diagnosis not present

## 2023-07-02 DIAGNOSIS — R051 Acute cough: Secondary | ICD-10-CM | POA: Diagnosis not present

## 2023-07-10 DIAGNOSIS — H6992 Unspecified Eustachian tube disorder, left ear: Secondary | ICD-10-CM | POA: Diagnosis not present

## 2023-07-10 DIAGNOSIS — H6121 Impacted cerumen, right ear: Secondary | ICD-10-CM | POA: Diagnosis not present

## 2023-08-03 DIAGNOSIS — B002 Herpesviral gingivostomatitis and pharyngotonsillitis: Secondary | ICD-10-CM | POA: Diagnosis not present

## 2023-09-03 ENCOUNTER — Encounter (INDEPENDENT_AMBULATORY_CARE_PROVIDER_SITE_OTHER): Payer: Self-pay | Admitting: Otolaryngology

## 2023-09-14 DIAGNOSIS — H6992 Unspecified Eustachian tube disorder, left ear: Secondary | ICD-10-CM | POA: Diagnosis not present

## 2023-09-24 DIAGNOSIS — H40053 Ocular hypertension, bilateral: Secondary | ICD-10-CM | POA: Diagnosis not present

## 2023-09-24 DIAGNOSIS — H40013 Open angle with borderline findings, low risk, bilateral: Secondary | ICD-10-CM | POA: Diagnosis not present

## 2023-09-24 DIAGNOSIS — H40043 Steroid responder, bilateral: Secondary | ICD-10-CM | POA: Diagnosis not present

## 2023-09-30 DIAGNOSIS — I10 Essential (primary) hypertension: Secondary | ICD-10-CM | POA: Diagnosis not present

## 2023-09-30 DIAGNOSIS — E7800 Pure hypercholesterolemia, unspecified: Secondary | ICD-10-CM | POA: Diagnosis not present

## 2023-09-30 DIAGNOSIS — R7303 Prediabetes: Secondary | ICD-10-CM | POA: Diagnosis not present

## 2023-10-05 DIAGNOSIS — Z23 Encounter for immunization: Secondary | ICD-10-CM | POA: Diagnosis not present

## 2023-10-05 DIAGNOSIS — Z0000 Encounter for general adult medical examination without abnormal findings: Secondary | ICD-10-CM | POA: Diagnosis not present

## 2023-10-05 DIAGNOSIS — E7800 Pure hypercholesterolemia, unspecified: Secondary | ICD-10-CM | POA: Diagnosis not present

## 2023-10-05 DIAGNOSIS — R519 Headache, unspecified: Secondary | ICD-10-CM | POA: Diagnosis not present

## 2023-10-05 DIAGNOSIS — R7303 Prediabetes: Secondary | ICD-10-CM | POA: Diagnosis not present

## 2023-10-05 DIAGNOSIS — I10 Essential (primary) hypertension: Secondary | ICD-10-CM | POA: Diagnosis not present

## 2023-10-06 ENCOUNTER — Other Ambulatory Visit: Payer: Self-pay | Admitting: Family Medicine

## 2023-10-06 DIAGNOSIS — R519 Headache, unspecified: Secondary | ICD-10-CM

## 2023-10-20 ENCOUNTER — Encounter: Payer: Self-pay | Admitting: Family Medicine

## 2023-10-21 ENCOUNTER — Ambulatory Visit
Admission: RE | Admit: 2023-10-21 | Discharge: 2023-10-21 | Disposition: A | Discharge status: Home / Self Care | Payer: BC Managed Care – PPO | Source: Ambulatory Visit | Attending: Family Medicine | Admitting: Family Medicine

## 2023-10-21 DIAGNOSIS — R519 Headache, unspecified: Secondary | ICD-10-CM | POA: Diagnosis not present

## 2023-10-21 DIAGNOSIS — H538 Other visual disturbances: Secondary | ICD-10-CM | POA: Diagnosis not present

## 2023-10-25 ENCOUNTER — Other Ambulatory Visit: Payer: BC Managed Care – PPO

## 2023-10-29 ENCOUNTER — Other Ambulatory Visit: Payer: BC Managed Care – PPO

## 2023-11-03 DIAGNOSIS — R051 Acute cough: Secondary | ICD-10-CM | POA: Diagnosis not present

## 2023-11-03 DIAGNOSIS — H9202 Otalgia, left ear: Secondary | ICD-10-CM | POA: Diagnosis not present

## 2023-11-03 DIAGNOSIS — J324 Chronic pansinusitis: Secondary | ICD-10-CM | POA: Diagnosis not present

## 2023-11-03 DIAGNOSIS — R0981 Nasal congestion: Secondary | ICD-10-CM | POA: Diagnosis not present

## 2023-12-07 ENCOUNTER — Other Ambulatory Visit: Payer: Self-pay | Admitting: Family Medicine

## 2023-12-07 DIAGNOSIS — Z1231 Encounter for screening mammogram for malignant neoplasm of breast: Secondary | ICD-10-CM

## 2024-01-04 ENCOUNTER — Ambulatory Visit: Payer: BC Managed Care – PPO

## 2024-01-05 ENCOUNTER — Ambulatory Visit
Admission: RE | Admit: 2024-01-05 | Discharge: 2024-01-05 | Disposition: A | Discharge status: Home / Self Care | Payer: BC Managed Care – PPO | Source: Ambulatory Visit | Attending: Family Medicine | Admitting: Family Medicine

## 2024-01-05 DIAGNOSIS — Z1231 Encounter for screening mammogram for malignant neoplasm of breast: Secondary | ICD-10-CM | POA: Diagnosis not present

## 2024-01-11 IMAGING — MG MM DIGITAL SCREENING BILAT W/ TOMO AND CAD
8 series · 8 of 24 positions shown · non-contrast
Comparison: Previous exam(s).

CLINICAL DATA: Screening.

EXAM:
DIGITAL SCREENING BILATERAL MAMMOGRAM WITH TOMOSYNTHESIS AND CAD
TECHNIQUE: Bilateral screening digital craniocaudal and mediolateral oblique
mammograms were obtained. Bilateral screening digital breast
tomosynthesis was performed. The images were evaluated with
computer-aided detection.

[L MLO synth-2D]
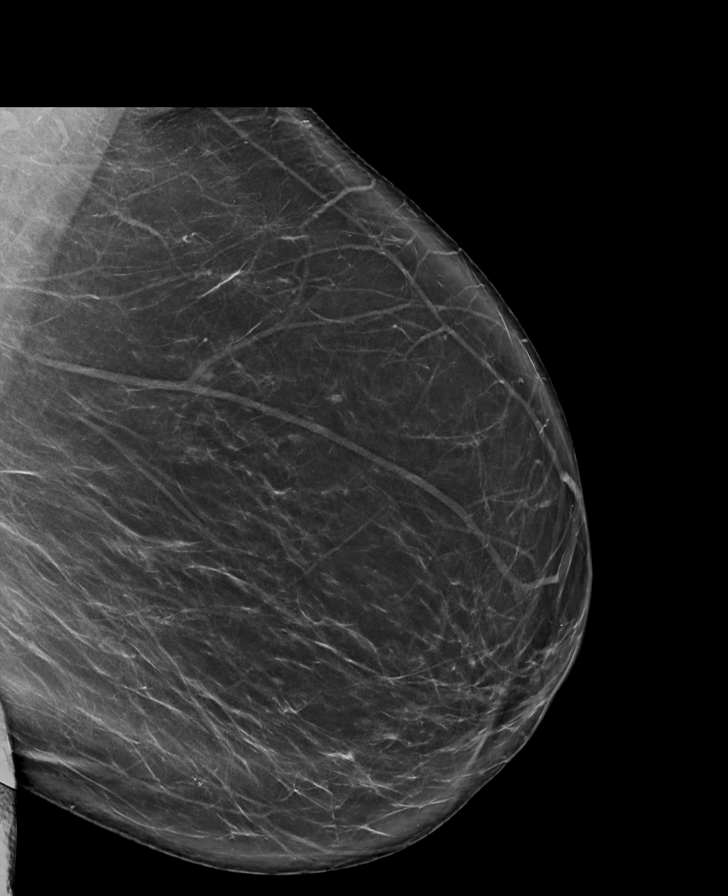

[R CC synth-2D]
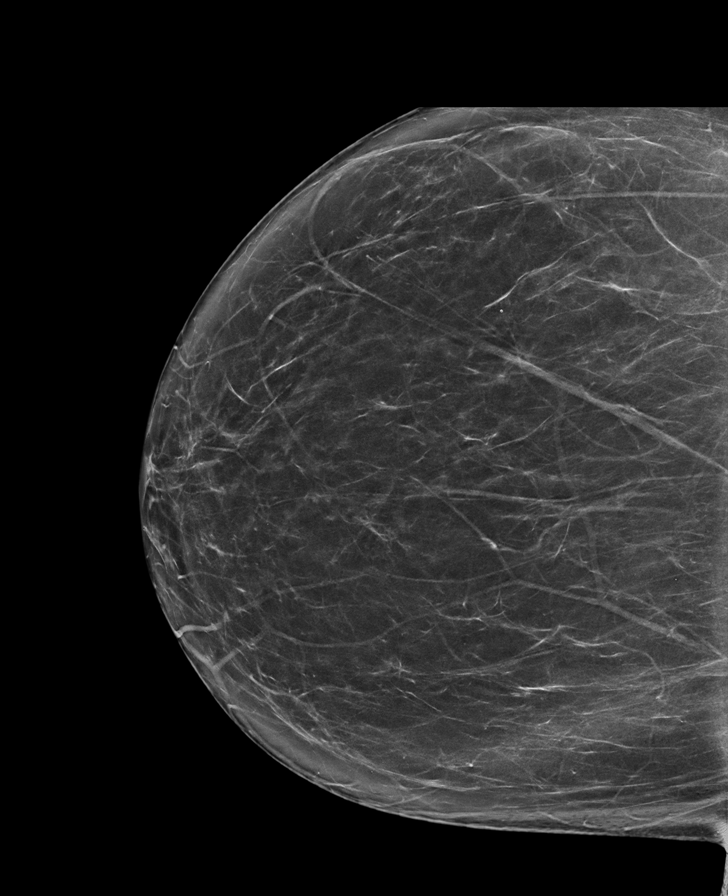

[L CC synth-2D]
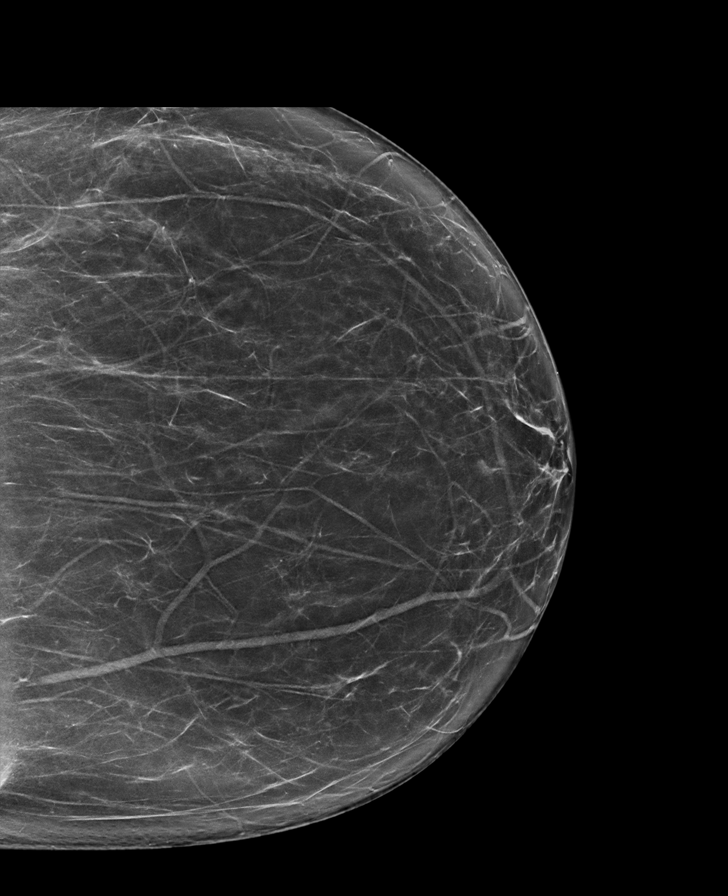

[R MLO synth-2D]
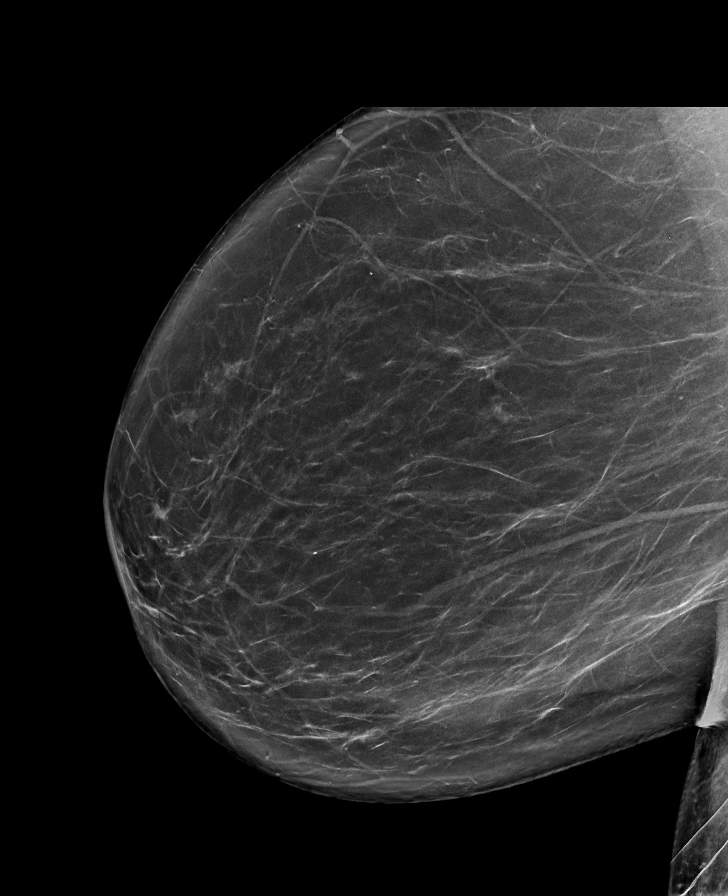

[L CC tomo · tomo slice 41/82.0]
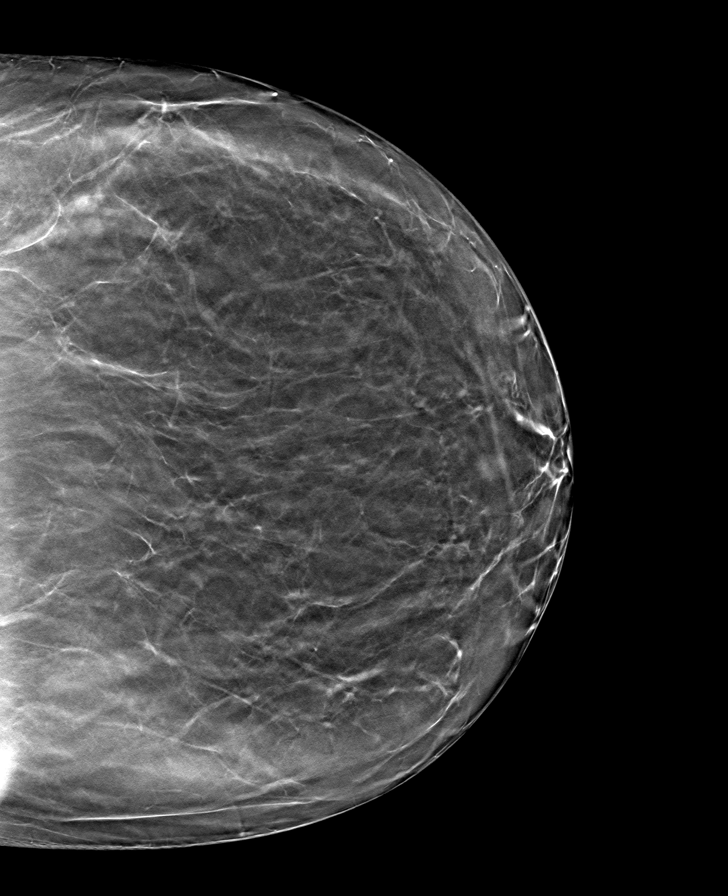

[R CC tomo · tomo slice 42/83.0]
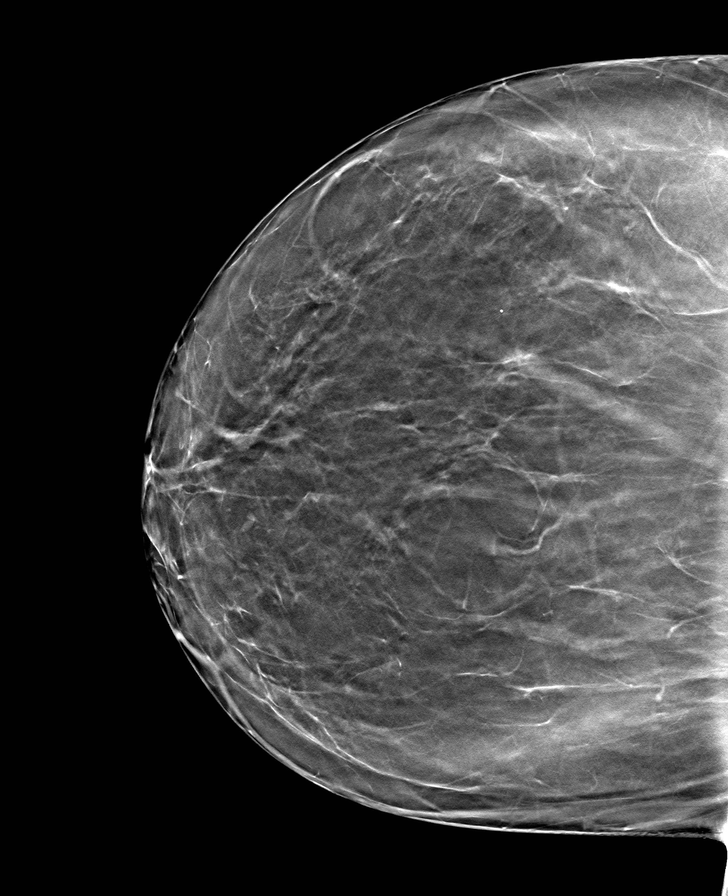

[R MLO tomo · tomo slice 47/93.0]
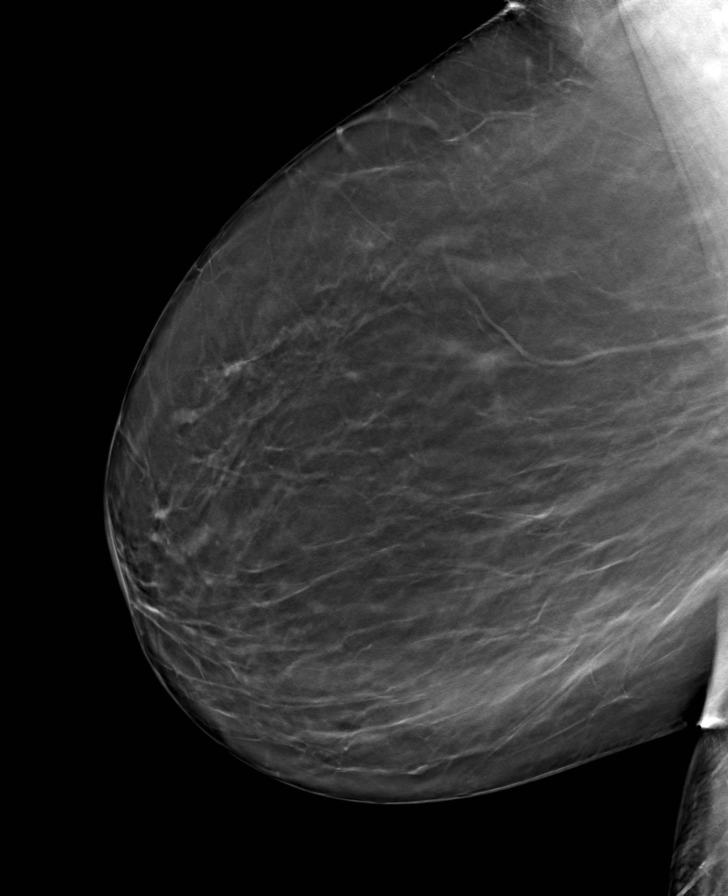

[L MLO tomo · tomo slice 47/94.0]
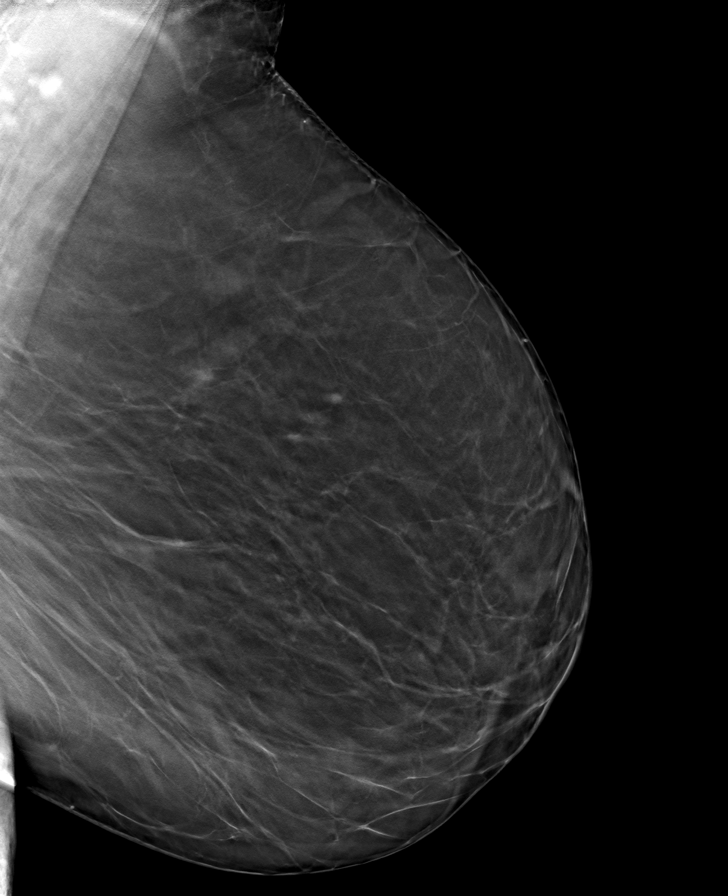

[8 of 24 positions shown; findings below may reference images not displayed]

ACR Breast Density Category b: There are scattered areas of
fibroglandular density.
FINDINGS: There are no findings suspicious for malignancy.
IMPRESSION: No mammographic evidence of malignancy. A result letter of this
screening mammogram will be mailed directly to the patient.

RECOMMENDATION:
Screening mammogram in one year. (Code:51-O-LD2)

BI-RADS CATEGORY  1: Negative.

## 2024-02-25 DIAGNOSIS — M6701 Short Achilles tendon (acquired), right ankle: Secondary | ICD-10-CM | POA: Diagnosis not present

## 2024-02-25 DIAGNOSIS — M722 Plantar fascial fibromatosis: Secondary | ICD-10-CM | POA: Diagnosis not present

## 2024-02-27 DIAGNOSIS — R0981 Nasal congestion: Secondary | ICD-10-CM | POA: Diagnosis not present

## 2024-03-02 DIAGNOSIS — M722 Plantar fascial fibromatosis: Secondary | ICD-10-CM | POA: Diagnosis not present

## 2024-04-05 DIAGNOSIS — I10 Essential (primary) hypertension: Secondary | ICD-10-CM | POA: Diagnosis not present

## 2024-04-05 DIAGNOSIS — D509 Iron deficiency anemia, unspecified: Secondary | ICD-10-CM | POA: Diagnosis not present

## 2024-04-05 DIAGNOSIS — R7303 Prediabetes: Secondary | ICD-10-CM | POA: Diagnosis not present

## 2024-04-11 DIAGNOSIS — E7800 Pure hypercholesterolemia, unspecified: Secondary | ICD-10-CM | POA: Diagnosis not present

## 2024-04-11 DIAGNOSIS — I10 Essential (primary) hypertension: Secondary | ICD-10-CM | POA: Diagnosis not present

## 2024-04-11 DIAGNOSIS — K219 Gastro-esophageal reflux disease without esophagitis: Secondary | ICD-10-CM | POA: Diagnosis not present

## 2024-04-11 DIAGNOSIS — E669 Obesity, unspecified: Secondary | ICD-10-CM | POA: Diagnosis not present

## 2024-04-12 DIAGNOSIS — R519 Headache, unspecified: Secondary | ICD-10-CM | POA: Diagnosis not present

## 2024-04-12 DIAGNOSIS — R059 Cough, unspecified: Secondary | ICD-10-CM | POA: Diagnosis not present

## 2024-04-12 DIAGNOSIS — R0981 Nasal congestion: Secondary | ICD-10-CM | POA: Diagnosis not present

## 2024-04-19 DIAGNOSIS — M6701 Short Achilles tendon (acquired), right ankle: Secondary | ICD-10-CM | POA: Diagnosis not present

## 2024-04-19 DIAGNOSIS — M722 Plantar fascial fibromatosis: Secondary | ICD-10-CM | POA: Diagnosis not present

## 2024-05-18 DIAGNOSIS — L089 Local infection of the skin and subcutaneous tissue, unspecified: Secondary | ICD-10-CM | POA: Diagnosis not present

## 2024-05-18 DIAGNOSIS — D1809 Hemangioma of other sites: Secondary | ICD-10-CM | POA: Diagnosis not present

## 2024-05-18 DIAGNOSIS — D485 Neoplasm of uncertain behavior of skin: Secondary | ICD-10-CM | POA: Diagnosis not present

## 2024-05-18 DIAGNOSIS — D225 Melanocytic nevi of trunk: Secondary | ICD-10-CM | POA: Diagnosis not present

## 2024-05-18 DIAGNOSIS — L578 Other skin changes due to chronic exposure to nonionizing radiation: Secondary | ICD-10-CM | POA: Diagnosis not present

## 2024-05-18 DIAGNOSIS — L570 Actinic keratosis: Secondary | ICD-10-CM | POA: Diagnosis not present

## 2024-05-18 DIAGNOSIS — D2372 Other benign neoplasm of skin of left lower limb, including hip: Secondary | ICD-10-CM | POA: Diagnosis not present

## 2024-06-17 DIAGNOSIS — D1720 Benign lipomatous neoplasm of skin and subcutaneous tissue of unspecified limb: Secondary | ICD-10-CM | POA: Diagnosis not present

## 2024-07-14 DIAGNOSIS — H04123 Dry eye syndrome of bilateral lacrimal glands: Secondary | ICD-10-CM | POA: Diagnosis not present

## 2024-07-14 DIAGNOSIS — H53143 Visual discomfort, bilateral: Secondary | ICD-10-CM | POA: Diagnosis not present

## 2024-10-10 DIAGNOSIS — R7303 Prediabetes: Secondary | ICD-10-CM | POA: Diagnosis not present

## 2024-10-10 DIAGNOSIS — E7800 Pure hypercholesterolemia, unspecified: Secondary | ICD-10-CM | POA: Diagnosis not present

## 2024-10-10 DIAGNOSIS — I10 Essential (primary) hypertension: Secondary | ICD-10-CM | POA: Diagnosis not present

## 2024-10-10 DIAGNOSIS — D509 Iron deficiency anemia, unspecified: Secondary | ICD-10-CM | POA: Diagnosis not present

## 2024-10-17 DIAGNOSIS — I10 Essential (primary) hypertension: Secondary | ICD-10-CM | POA: Diagnosis not present

## 2024-10-17 DIAGNOSIS — E7800 Pure hypercholesterolemia, unspecified: Secondary | ICD-10-CM | POA: Diagnosis not present

## 2024-10-17 DIAGNOSIS — Z0000 Encounter for general adult medical examination without abnormal findings: Secondary | ICD-10-CM | POA: Diagnosis not present

## 2024-10-17 DIAGNOSIS — Z23 Encounter for immunization: Secondary | ICD-10-CM | POA: Diagnosis not present

## 2024-10-17 DIAGNOSIS — R7303 Prediabetes: Secondary | ICD-10-CM | POA: Diagnosis not present

## 2024-10-17 DIAGNOSIS — D509 Iron deficiency anemia, unspecified: Secondary | ICD-10-CM | POA: Diagnosis not present

## 2024-11-01 DIAGNOSIS — H6692 Otitis media, unspecified, left ear: Secondary | ICD-10-CM | POA: Diagnosis not present
